# Patient Record
Sex: Female | Born: 1960 | Race: White | Hispanic: No | Smoking: Never smoker
Health system: Southern US, Community
[De-identification: ages and names within clinical notes are randomized; demographics above are authoritative.]

## PROBLEM LIST (undated history)

## (undated) DIAGNOSIS — I10 Essential (primary) hypertension: Secondary | ICD-10-CM

## (undated) DIAGNOSIS — E119 Type 2 diabetes mellitus without complications: Secondary | ICD-10-CM

## (undated) HISTORY — PX: OTHER SURGICAL HISTORY: SHX169

## (undated) HISTORY — PX: BREAST BIOPSY: SHX20

## (undated) HISTORY — PX: ABDOMINAL HYSTERECTOMY: SHX81

## (undated) HISTORY — PX: CATARACT EXTRACTION: SUR2

## (undated) HISTORY — DX: Essential (primary) hypertension: I10

---

## 2015-07-22 ENCOUNTER — Emergency Department: Payer: Self-pay

## 2015-07-22 ENCOUNTER — Emergency Department
Admission: EM | Admit: 2015-07-22 | Discharge: 2015-07-22 | Disposition: A | Discharge status: Home / Self Care | Payer: Self-pay | Attending: Emergency Medicine | Admitting: Emergency Medicine

## 2015-07-22 DIAGNOSIS — G8929 Other chronic pain: Secondary | ICD-10-CM | POA: Insufficient documentation

## 2015-07-22 DIAGNOSIS — M542 Cervicalgia: Secondary | ICD-10-CM | POA: Insufficient documentation

## 2015-07-22 DIAGNOSIS — I10 Essential (primary) hypertension: Secondary | ICD-10-CM | POA: Insufficient documentation

## 2015-07-22 HISTORY — DX: Essential (primary) hypertension: I10
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
## 2015-07-22 NOTE — ED Provider Notes (Signed)
Physician/Midlevel provider first contact with patient: 07/22/15 1430         [facility] Emergency Department  ED ATTENDING NOTE  ___________________________  Attending Flonnie Hailstone  First MD: Kathi Simpers  The patient was seen and examined by PA or NP; and the plan of care was discussed with me.   I have reviewed the history and physical exam documented with any additions/changes documented here.  I was present for the key portions of the exam/procedures.  I have reviewed the patient's medical history, vital signs, nursing notes, and any labs or imaging studies performed.  ___________________________      Scribe Attestation:  I was acting as a Neurosurgeon for Eduardo Osier, MD on Gemma Payor, [place]  I am the first provider for this patient and I personally performed the services documented. Dorathy Daft is scribing for me on [place],Phylisha SUE. This note accurately reflects work and decisions made by me.  Donaven Criswell, Placido Sou, MD      Eduardo Osier, MD  07/23/15 [phone]
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
## 2016-10-13 ENCOUNTER — Other Ambulatory Visit: Payer: Self-pay | Admitting: "Endocrinology

## 2018-03-02 ENCOUNTER — Other Ambulatory Visit: Payer: Self-pay

## 2018-03-02 ENCOUNTER — Encounter (HOSPITAL_COMMUNITY): Payer: Self-pay | Admitting: Emergency Medicine

## 2018-03-02 ENCOUNTER — Emergency Department (HOSPITAL_COMMUNITY)
Admission: EM | Admit: 2018-03-02 | Discharge: 2018-03-03 | Disposition: A | Discharge status: Home / Self Care | Payer: 59 | Attending: Emergency Medicine | Admitting: Emergency Medicine

## 2018-03-02 DIAGNOSIS — H65112 Acute and subacute allergic otitis media (mucoid) (sanguinous) (serous), left ear: Secondary | ICD-10-CM

## 2018-03-02 DIAGNOSIS — H65192 Other acute nonsuppurative otitis media, left ear: Secondary | ICD-10-CM | POA: Diagnosis not present

## 2018-03-02 DIAGNOSIS — R739 Hyperglycemia, unspecified: Secondary | ICD-10-CM

## 2018-03-02 DIAGNOSIS — Z7984 Long term (current) use of oral hypoglycemic drugs: Secondary | ICD-10-CM | POA: Insufficient documentation

## 2018-03-02 DIAGNOSIS — E1165 Type 2 diabetes mellitus with hyperglycemia: Secondary | ICD-10-CM | POA: Insufficient documentation

## 2018-03-02 HISTORY — DX: Type 2 diabetes mellitus without complications: E11.9

## 2018-03-02 LAB — COMPREHENSIVE METABOLIC PANEL
ALBUMIN: 3.8 g/dL (ref 3.5–5.0)
ALK PHOS: 74 U/L (ref 38–126)
ALT: 27 U/L (ref 14–54)
AST: 34 U/L (ref 15–41)
Anion gap: 13 (ref 5–15)
BUN: 16 mg/dL (ref 6–20)
CHLORIDE: 94 mmol/L — AB (ref 101–111)
CO2: 20 mmol/L — AB (ref 22–32)
CREATININE: 1.13 mg/dL — AB (ref 0.44–1.00)
Calcium: 9 mg/dL (ref 8.9–10.3)
GFR calc non Af Amer: 53 mL/min — ABNORMAL LOW (ref >60)
GLUCOSE: 563 mg/dL — AB (ref 65–99)
Potassium: 3.9 mmol/L (ref 3.5–5.1)
SODIUM: 127 mmol/L — AB (ref 135–145)
Total Bilirubin: 0.6 mg/dL (ref 0.3–1.2)
Total Protein: 7.6 g/dL (ref 6.5–8.1)

## 2018-03-02 LAB — CBC WITH DIFFERENTIAL/PLATELET
BASOS ABS: 0 10*3/uL (ref 0.0–0.1)
BASOS PCT: 0 %
EOS ABS: 0 10*3/uL (ref 0.0–0.7)
EOS PCT: 0 %
HCT: 37.4 % (ref 36.0–46.0)
Hemoglobin: 12.6 g/dL (ref 12.0–15.0)
Lymphocytes Relative: 15 %
Lymphs Abs: 1.8 10*3/uL (ref 0.7–4.0)
MCH: 28.8 pg (ref 26.0–34.0)
MCHC: 33.7 g/dL (ref 30.0–36.0)
MCV: 85.4 fL (ref 78.0–100.0)
Monocytes Absolute: 1.1 10*3/uL — ABNORMAL HIGH (ref 0.1–1.0)
Monocytes Relative: 10 %
Neutro Abs: 8.5 10*3/uL — ABNORMAL HIGH (ref 1.7–7.7)
Neutrophils Relative %: 75 %
PLATELETS: 274 10*3/uL (ref 150–400)
RBC: 4.38 MIL/uL (ref 3.87–5.11)
RDW: 13.2 % (ref 11.5–15.5)
WBC: 11.3 10*3/uL — AB (ref 4.0–10.5)

## 2018-03-02 LAB — CBG MONITORING, ED: Glucose-Capillary: 573 mg/dL (ref 65–99)

## 2018-03-02 LAB — URINALYSIS, ROUTINE W REFLEX MICROSCOPIC
BACTERIA UA: NONE SEEN
Bilirubin Urine: NEGATIVE
Glucose, UA: >=500 mg/dL — AB
Ketones, ur: NEGATIVE mg/dL
Leukocytes, UA: NEGATIVE
NITRITE: NEGATIVE
PROTEIN: NEGATIVE mg/dL
Specific Gravity, Urine: 1.031 — ABNORMAL HIGH (ref 1.005–1.030)
pH: 6 (ref 5.0–8.0)

## 2018-03-02 MED ORDER — AMOXICILLIN-POT CLAVULANATE 875-125 MG PO TABS
1.0000 | ORAL_TABLET | Freq: Once | ORAL | Status: AC
  Administered 2018-03-03: 1 via ORAL
  Filled 2018-03-02: qty 1

## 2018-03-02 MED ORDER — INSULIN ASPART 100 UNIT/ML ~~LOC~~ SOLN
10.0000 [IU] | Freq: Once | SUBCUTANEOUS | Status: AC
  Administered 2018-03-03: 10 [IU] via INTRAVENOUS
  Filled 2018-03-02: qty 1

## 2018-03-02 MED ORDER — SODIUM CHLORIDE 0.9 % IV BOLUS (SEPSIS)
1000.0000 mL | Freq: Once | INTRAVENOUS | Status: AC
  Administered 2018-03-03: 1000 mL via INTRAVENOUS

## 2018-03-02 MED ORDER — HYDROCODONE-ACETAMINOPHEN 5-325 MG PO TABS
2.0000 | ORAL_TABLET | Freq: Once | ORAL | Status: AC
  Administered 2018-03-03: 2 via ORAL
  Filled 2018-03-02: qty 2

## 2018-03-02 NOTE — ED Notes (Signed)
Checked CGB 573, RN Bobby informed

## 2018-03-02 NOTE — ED Provider Notes (Signed)
[facility] EMERGENCY DEPARTMENT Provider Note   CSN: [id number] Arrival date & time: 03/02/18  1900     History   Chief Complaint Chief Complaint  Patient presents with  . Hyperglycemia    CBG= 573  . Otalgia    HPI Carly Maxwell is a 57 y.o. female.  Patient presents to the ED with a chief complaint of left ear pain.  She states that the pain started this morning.  This is what brought her to the ED.  She denies any associated fevers, chills, or cough.  She states that she has not taken anything for her symptoms.  She reports that she has been out of her DM meds for the past 3 months due to relocating to the area.  She denies any other associated symptoms.  There are no modifying factors.   The history is provided by the patient. No language interpreter was used.    Past Medical History:  Diagnosis Date  . Diabetes mellitus without complication (HCC)     There are no active problems to display for this patient.   Past Surgical History:  Procedure Laterality Date  . ABDOMINAL HYSTERECTOMY    . arm surgery    . CATARACT EXTRACTION      OB History    No data available       Home Medications    Prior to Admission medications   Medication Sig Start Date End Date Taking? Authorizing Provider  glipiZIDE (GLUCOTROL) 10 MG tablet Take 10 mg by mouth 2 (two) times daily before a meal.   Yes [provider]  metFORMIN (GLUCOPHAGE) 1000 MG tablet Take 1,000 mg by mouth 2 (two) times daily with a meal.   Yes [provider]    Family History No family history on file.  Social History Social History   Tobacco Use  . Smoking status: Never Smoker  . Smokeless tobacco: Never Used  Substance Use Topics  . Alcohol use: No    Frequency: Never  . Drug use: No     Allergies   Lantus [insulin glargine]   Review of Systems Review of Systems  All other systems reviewed and are negative.    Physical Exam Updated Vital  Signs BP (!) 157/84   Pulse (!) 106   Temp 98.3 F (36.8 C) (Oral)   Resp 20   LMP  (Approximate)   SpO2 99%   Physical Exam  Constitutional: She is oriented to person, place, and time. She appears well-developed and well-nourished.  HENT:  Head: Normocephalic and atraumatic.  Left mucoid effusion with minimal erythema involving TM Right is clear  Eyes: Conjunctivae and EOM are normal. Pupils are equal, round, and reactive to light.  Neck: Normal range of motion. Neck supple.  Cardiovascular: Regular rhythm. Exam reveals no gallop and no friction rub.  No murmur heard. mild tachycardia  Pulmonary/Chest: Effort normal and breath sounds normal. No respiratory distress. She has no wheezes. She has no rales. She exhibits no tenderness.  Abdominal: Soft. Bowel sounds are normal. She exhibits no distension and no mass. There is no tenderness. There is no rebound and no guarding.  Musculoskeletal: Normal range of motion. She exhibits no edema or tenderness.  Neurological: She is alert and oriented to person, place, and time.  Skin: Skin is warm and dry.  Psychiatric: She has a normal mood and affect. Her behavior is normal. Judgment and thought content normal.  Nursing note and vitals reviewed.  ED Treatments / Results  Labs (all labs ordered are listed, but only abnormal results are displayed) Labs Reviewed  CBC WITH DIFFERENTIAL/PLATELET - Abnormal; Notable for the following components:      Result Value   WBC 11.3 (*)    Neutro Abs 8.5 (*)    Monocytes Absolute 1.1 (*)    All other components within normal limits  COMPREHENSIVE METABOLIC PANEL - Abnormal; Notable for the following components:   Sodium 127 (*)    Chloride 94 (*)    CO2 20 (*)    Glucose, Bld 563 (*)    Creatinine, Ser 1.13 (*)    GFR calc non Af Amer 53 (*)    All other components within normal limits  URINALYSIS, ROUTINE W REFLEX MICROSCOPIC - Abnormal; Notable for the following components:   Color,  Urine STRAW (*)    Specific Gravity, Urine 1.031 (*)    Glucose, UA >=500 (*)    Hgb urine dipstick SMALL (*)    Squamous Epithelial / LPF 0-5 (*)    All other components within normal limits  CBG MONITORING, ED - Abnormal; Notable for the following components:   Glucose-Capillary 573 (*)    All other components within normal limits  CBG MONITORING, ED - Abnormal; Notable for the following components:   Glucose-Capillary 246 (*)    All other components within normal limits    EKG  EKG Interpretation None       Radiology No results found.  Procedures Procedures (including critical care time)  Medications Ordered in ED Medications  HYDROcodone-acetaminophen (NORCO/VICODIN) 5-325 MG per tablet 2 tablet (not administered)  amoxicillin-clavulanate (AUGMENTIN) 875-125 MG per tablet 1 tablet (not administered)  sodium chloride 0.9 % bolus 1,000 mL (not administered)  insulin aspart (novoLOG) injection 10 Units (not administered)     Initial Impression / Assessment and Plan / ED Course  I have reviewed the triage vital signs and the nursing notes.  Pertinent labs & imaging results that were available during my care of the patient were reviewed by me and considered in my medical decision making (see chart for details).     Patient with left sided otalgia.  Exam is consistent with OM.  Will treat with augmentin.  Noted to be hyperglycemic.  Given IV insulin and IVF.  Glucose trending down nicely.  Will refill home meds as courtesy.  [facility] f/u.  Final Clinical Impressions(s) / ED Diagnoses   Final diagnoses:  Hyperglycemia  Acute mucoid otitis media of left ear    ED Discharge Orders        Ordered    amoxicillin-clavulanate (AUGMENTIN) 875-125 MG tablet  Every 12 hours     03/03/18 0157    glipiZIDE (GLUCOTROL) 10 MG tablet  2 times daily before meals     03/03/18 0157    metFORMIN (GLUCOPHAGE) 1000 MG tablet  2 times daily with meals      03/03/18 0157       Roxy Horseman, PA-C 03/03/18 0159    Tegeler, Canary Brim, MD 03/03/18 0230

## 2018-03-02 NOTE — ED Notes (Signed)
Pt alert and oriented x4. Skin warm and dry. Respirations equal and unlabored. s1 and s2 heart sounds audible. Pt states is type 2 diabetic and has not had her meds in three months. Her prescription run out and she has is not from the area.

## 2018-03-02 NOTE — ED Triage Notes (Signed)
Patient reports left earache onset last night , denies injury , no hearing loss or drainage , CBG= 573 at triage .

## 2018-03-03 ENCOUNTER — Other Ambulatory Visit: Payer: Self-pay

## 2018-03-03 LAB — CBG MONITORING, ED: Glucose-Capillary: 246 mg/dL — ABNORMAL HIGH (ref 65–99)

## 2018-03-03 MED ORDER — METFORMIN HCL 1000 MG PO TABS: 1000.0000 mg | ORAL_TABLET | Freq: Two times a day (BID) | ORAL | 0 refills | Status: AC

## 2018-03-03 MED ORDER — GLIPIZIDE 10 MG PO TABS: 10.0000 mg | ORAL_TABLET | Freq: Two times a day (BID) | ORAL | 0 refills | Status: DC

## 2018-03-03 MED ORDER — AMOXICILLIN-POT CLAVULANATE 875-125 MG PO TABS: 1.0000 | ORAL_TABLET | Freq: Two times a day (BID) | ORAL | 0 refills | Status: DC

## 2018-03-03 NOTE — ED Notes (Signed)
BGL 246

## 2018-04-29 DIAGNOSIS — Z794 Long term (current) use of insulin: Secondary | ICD-10-CM

## 2018-04-29 DIAGNOSIS — IMO0001 Reserved for inherently not codable concepts without codable children: Secondary | ICD-10-CM | POA: Insufficient documentation

## 2018-04-29 DIAGNOSIS — E1165 Type 2 diabetes mellitus with hyperglycemia: Secondary | ICD-10-CM

## 2018-06-17 ENCOUNTER — Emergency Department (HOSPITAL_COMMUNITY)
Admission: EM | Admit: 2018-06-17 | Discharge: 2018-06-17 | Disposition: A | Discharge status: Home / Self Care | Payer: 59 | Attending: Emergency Medicine | Admitting: Emergency Medicine

## 2018-06-17 ENCOUNTER — Other Ambulatory Visit: Payer: Self-pay

## 2018-06-17 ENCOUNTER — Emergency Department (HOSPITAL_COMMUNITY): Payer: 59

## 2018-06-17 ENCOUNTER — Encounter (HOSPITAL_COMMUNITY): Payer: Self-pay | Admitting: Emergency Medicine

## 2018-06-17 DIAGNOSIS — K529 Noninfective gastroenteritis and colitis, unspecified: Secondary | ICD-10-CM | POA: Diagnosis not present

## 2018-06-17 DIAGNOSIS — E119 Type 2 diabetes mellitus without complications: Secondary | ICD-10-CM | POA: Diagnosis not present

## 2018-06-17 DIAGNOSIS — Z79899 Other long term (current) drug therapy: Secondary | ICD-10-CM | POA: Insufficient documentation

## 2018-06-17 DIAGNOSIS — R109 Unspecified abdominal pain: Secondary | ICD-10-CM | POA: Diagnosis present

## 2018-06-17 LAB — URINALYSIS, ROUTINE W REFLEX MICROSCOPIC
BILIRUBIN URINE: NEGATIVE
Hgb urine dipstick: NEGATIVE
Ketones, ur: 5 mg/dL — AB
LEUKOCYTES UA: NEGATIVE
Nitrite: NEGATIVE
PH: 5 (ref 5.0–8.0)
Protein, ur: NEGATIVE mg/dL
Specific Gravity, Urine: 1.032 — ABNORMAL HIGH (ref 1.005–1.030)

## 2018-06-17 LAB — COMPREHENSIVE METABOLIC PANEL
ALT: 19 U/L (ref 0–44)
AST: 25 U/L (ref 15–41)
Albumin: 3.9 g/dL (ref 3.5–5.0)
Alkaline Phosphatase: 77 U/L (ref 38–126)
Anion gap: 11 (ref 5–15)
BILIRUBIN TOTAL: 0.4 mg/dL (ref 0.3–1.2)
BUN: 10 mg/dL (ref 6–20)
CALCIUM: 9.2 mg/dL (ref 8.9–10.3)
CO2: 21 mmol/L — ABNORMAL LOW (ref 22–32)
Chloride: 106 mmol/L (ref 98–111)
Creatinine, Ser: 0.59 mg/dL (ref 0.44–1.00)
Glucose, Bld: 300 mg/dL — ABNORMAL HIGH (ref 70–99)
Potassium: 4 mmol/L (ref 3.5–5.1)
Sodium: 138 mmol/L (ref 135–145)
TOTAL PROTEIN: 7.3 g/dL (ref 6.5–8.1)

## 2018-06-17 LAB — CBC
HCT: 40.5 % (ref 36.0–46.0)
Hemoglobin: 13.5 g/dL (ref 12.0–15.0)
MCH: 28.6 pg (ref 26.0–34.0)
MCHC: 33.3 g/dL (ref 30.0–36.0)
MCV: 85.8 fL (ref 78.0–100.0)
PLATELETS: 338 10*3/uL (ref 150–400)
RBC: 4.72 MIL/uL (ref 3.87–5.11)
RDW: 13.2 % (ref 11.5–15.5)
WBC: 6.4 10*3/uL (ref 4.0–10.5)

## 2018-06-17 LAB — LIPASE, BLOOD: Lipase: 45 U/L (ref 11–51)

## 2018-06-17 LAB — POC OCCULT BLOOD, ED: Fecal Occult Bld: POSITIVE — AB

## 2018-06-17 MED ORDER — METRONIDAZOLE 500 MG PO TABS: 500.0000 mg | ORAL_TABLET | Freq: Three times a day (TID) | ORAL | 0 refills | Status: AC

## 2018-06-17 MED ORDER — HYDROMORPHONE HCL 1 MG/ML IJ SOLN
0.5000 mg | Freq: Once | INTRAMUSCULAR | Status: AC
  Administered 2018-06-17: 0.5 mg via INTRAVENOUS
  Filled 2018-06-17: qty 1

## 2018-06-17 MED ORDER — ONDANSETRON 4 MG PO TBDP: 4.0000 mg | ORAL_TABLET | Freq: Three times a day (TID) | ORAL | 0 refills | Status: AC | PRN

## 2018-06-17 MED ORDER — IOHEXOL 300 MG/ML  SOLN
100.0000 mL | Freq: Once | INTRAMUSCULAR | Status: AC | PRN
  Administered 2018-06-17: 100 mL via INTRAVENOUS

## 2018-06-17 MED ORDER — SODIUM CHLORIDE 0.9 % IV BOLUS: 1000.0000 mL | Freq: Once | INTRAVENOUS | Status: DC

## 2018-06-17 MED ORDER — ONDANSETRON HCL 4 MG/2ML IJ SOLN
4.0000 mg | Freq: Once | INTRAMUSCULAR | Status: AC
  Administered 2018-06-17: 4 mg via INTRAVENOUS
  Filled 2018-06-17: qty 2

## 2018-06-17 MED ORDER — CIPROFLOXACIN HCL 500 MG PO TABS: 500.0000 mg | ORAL_TABLET | Freq: Two times a day (BID) | ORAL | 0 refills | Status: AC

## 2018-06-17 MED ORDER — OXYCODONE-ACETAMINOPHEN 5-325 MG PO TABS: 1.0000 | ORAL_TABLET | Freq: Three times a day (TID) | ORAL | 0 refills | Status: AC | PRN

## 2018-06-17 MED ORDER — SODIUM CHLORIDE 0.9 % IV BOLUS
1000.0000 mL | Freq: Once | INTRAVENOUS | Status: AC
  Administered 2018-06-17: 1000 mL via INTRAVENOUS

## 2018-06-17 NOTE — ED Notes (Signed)
Patient transported to CT 

## 2018-06-17 NOTE — ED Triage Notes (Signed)
Patient presents to the ED with complaints of  epigastric Abd pain that started last night Patient reports nausea denies any vomiting. Patient reports diarrhea. Patient states she is a diabetic and has not taken her insulin since yesterday due to pain. Patient  Reports increase burping. Patient alert and oriented rates pain 10/10.

## 2018-06-17 NOTE — Discharge Instructions (Addendum)
Thank you for allowing me to provide your care today in the emergency department.  Please call [facility] to schedule a follow-up appointment.  For  your abdominal pain, you are being prescribed 2 different antibiotics.  The first is ciprofloxacin and should be taken 1 tablet every 12 hours for the next 7 days.  The second antibiotic is metronidazole, which should be taken 1 tablet every 8 hours for the next 7 days.  Please make sure to avoid all alcohol while taking this medication as it can cause severe vomiting.  For mild to moderate pain, you can take 600 mg of ibuprofen with food or 650 mg of Tylenol once every 6 hours.  For severe, uncontrollable pain, take 1 tablet of Percocet every 8 hours.  Please do not drive or work while taking this medication because it is a narcotic and can cause you to be impaired.  Is also a narcotic and can be addicting.  For nausea, take 1 tablet of Zofran and let it dissolve under your tongue every 8 hours.  For the next few days, you can follow a bland diet until your symptoms start to improve.  Return to the emergency department if you have vomiting despite taking Zofran, high fever despite taking Tylenol or ibuprofen, severe, uncontrollable pain, or other new concerning symptoms.

## 2018-06-17 NOTE — ED Provider Notes (Signed)
[facility] Provider Note   CSN: [id number] Arrival date & time: 06/17/18  [id number]     History   Chief Complaint Chief Complaint  Patient presents with  . Abdominal Pain    HPI Carly Maxwell is a 57 y.o. female with a h/o of DM Type II presents to the emergency department from her PCP's office with a chief complaint of abdominal pain.  The patient endorses sudden onset, constant epigastric pain that began last night and radiates to her mid back.  She report the pain is constantly dull and moderate in severity, but she has waves of severe, crampy pain.  She reports associated nausea and 6-7 episodes of nonbloody diarrhea.  She also endorses some mild dull periumbilical pain and dysuria.  She reports that dysuria has been ongoing for the last few weeks.  She denies fever, chills, vaginal pain, itching, discharge, or bleeding, chest pain, dyspnea, hematuria, melena, or hematochezia.  She reports increased belching, but no increased flatulence.  No known aggravating or alleviating factors.  No recent travel or camping.  No known sick contacts.  She reports that she was unable to take her morning dose of insulin because the pain was so severe.  She reports that she left work due to the pain and went to see her PCP who checked her blood sugar and found that it was 332.  She reports that it typically runs in the 130s when she checks it in the morning.  No treatment prior to arrival.  Surgical history includes hysterectomy and cholecystectomy.  Antibiotic use was approximately 1 month ago when she was treated with amoxicillin for an ear infection.  She denies alcohol use, illicit or recreational drug use, and does not smoke cigarettes.  The history is provided by the patient. No language interpreter was used.    Past Medical History:  Diagnosis Date  . Diabetes mellitus without complication [facility](HCC)     Patient Active Problem List   Diagnosis Date Noted    . Uncontrolled type 2 diabetes mellitus without complication, with long-term current use of insulin (HCC) 04/29/2018    Past Surgical History:  Procedure Laterality Date  . ABDOMINAL HYSTERECTOMY    . arm surgery    . CATARACT EXTRACTION       OB History   None      Home Medications    Prior to Admission medications   Medication Sig Start Date End Date Taking? Authorizing Provider  calcium carbonate (TUMS - DOSED IN MG ELEMENTAL CALCIUM) 500 MG chewable tablet Chew 2 tablets by mouth daily as needed for indigestion or heartburn.   Yes [provider]  metFORMIN (GLUCOPHAGE) 1000 MG tablet Take 1 tablet (1,000 mg total) by mouth 2 (two) times daily with a meal. Patient taking differently: Take 500 mg by mouth 2 (two) times daily with a meal.  03/03/18  Yes Roxy HorsemanBrowning, Robert, PA-C  TRESIBA FLEXTOUCH 200 UNIT/ML SOPN Inject 16 Units into the skin daily. 05/13/18  Yes [provider]  VICTOZA 18 MG/3ML SOPN Inject 1.2 mg into the skin daily. 06/09/18  Yes [provider]  ciprofloxacin (CIPRO) 500 MG tablet Take 1 tablet (500 mg total) by mouth every 12 (twelve) hours for 7 days. 06/17/18 06/24/18  Greysen Swanton A, PA-C  metroNIDAZOLE (FLAGYL) 500 MG tablet Take 1 tablet (500 mg total) by mouth 3 (three) times daily for 7 days. 06/17/18 06/24/18  Vanesha Athens A, PA-C  ondansetron (ZOFRAN ODT) 4 MG disintegrating  tablet Take 1 tablet (4 mg total) by mouth every 8 (eight) hours as needed for nausea or vomiting. 06/17/18   Shell Blanchette A, PA-C  oxyCODONE-acetaminophen (PERCOCET/ROXICET) 5-325 MG tablet Take 1 tablet by mouth every 8 (eight) hours as needed for severe pain. 06/17/18   Elizzie Westergard A, PA-C    Family History No family history on file.  Social History Social History   Tobacco Use  . Smoking status: Never Smoker  . Smokeless tobacco: Never Used  Substance Use Topics  . Alcohol use: No    Frequency: Never  . Drug use: No     Allergies   Lantus  [insulin glargine]   Review of Systems Review of Systems  Constitutional: Negative for activity change, chills and fever.  HENT: Negative for congestion.   Respiratory: Negative for shortness of breath.   Cardiovascular: Negative for chest pain.  Gastrointestinal: Positive for abdominal pain, diarrhea and nausea. Negative for blood in stool, constipation and vomiting.  Genitourinary: Positive for dysuria. Negative for flank pain, hematuria, vaginal bleeding, vaginal discharge and vaginal pain.  Musculoskeletal: Negative for back pain.  Skin: Negative for rash.  Allergic/Immunologic: Negative for immunocompromised state.  Neurological: Negative for weakness and headaches.  Psychiatric/Behavioral: Negative for confusion.   Physical Exam Updated Vital Signs BP (!) 158/86   Pulse 72   Temp 98.1 F (36.7 C) (Oral)   Resp 14   Ht 5\' 3"  (1.6 m)   Wt 75.8 kg (167 lb)   SpO2 100%   BMI 29.58 kg/m   Physical Exam  Constitutional: No distress.  Uncomfortable appearing  HENT:  Head: Normocephalic.  Eyes: Conjunctivae are normal.  Neck: Neck supple.  Cardiovascular: Normal rate, regular rhythm, normal heart sounds and intact distal pulses. Exam reveals no gallop and no friction rub.  No murmur heard. Pulmonary/Chest: Effort normal. No stridor. No respiratory distress. She has no wheezes. She has no rales. She exhibits no tenderness.  Abdominal: Soft. She exhibits no distension and no mass. There is tenderness. There is no rebound and no guarding. No hernia.  Hyperactive bowel sounds in all 4 quadrants.  Patient is exquisitely tender to palpation in the left upper and lower quadrants with minimal palpation.  Mild right upper quadrant tenderness.  Guarding is present.  No rebound tenderness.    Right lower quadrant is nontender.  No CVA tenderness bilaterally.  Musculoskeletal: She exhibits no edema, tenderness or deformity.  Neurological: She is alert.  Skin: Skin is warm. Capillary  refill takes less than 2 seconds. No rash noted.  Psychiatric: Her behavior is normal.  Nursing note and vitals reviewed.    ED Treatments / Results  Labs (all labs ordered are listed, but only abnormal results are displayed) Labs Reviewed  COMPREHENSIVE METABOLIC PANEL - Abnormal; Notable for the following components:      Result Value   CO2 21 (*)    Glucose, Bld 300 (*)    All other components within normal limits  URINALYSIS, ROUTINE W REFLEX MICROSCOPIC - Abnormal; Notable for the following components:   APPearance HAZY (*)    Specific Gravity, Urine 1.032 (*)    Glucose, UA >=500 (*)    Ketones, ur 5 (*)    Bacteria, UA FEW (*)    All other components within normal limits  POC OCCULT BLOOD, ED - Abnormal; Notable for the following components:   Fecal Occult Bld POSITIVE (*)    All other components within normal limits  LIPASE, BLOOD  CBC  CBG MONITORING, ED    EKG None  Radiology Ct Abdomen Pelvis W Contrast  Result Date: 06/17/2018 CLINICAL DATA:  Generalized abdominal pain. EXAM: CT ABDOMEN AND PELVIS WITH CONTRAST TECHNIQUE: Multidetector CT imaging of the abdomen and pelvis was performed using the standard protocol following bolus administration of intravenous contrast. CONTRAST:  OMNIPAQUE IOHEXOL 300 MG/ML  SOLN COMPARISON:  None. FINDINGS: Lower chest: Lung bases demonstrate minimal bibasilar atelectatic change. Fissural lymph node over the right base. Hepatobiliary: Previous cholecystectomy. Liver and biliary tree are normal. Pancreas: Normal. Spleen: Normal. Adrenals/Urinary Tract: Adrenal glands are normal. Kidneys normal size without hydronephrosis or nephrolithiasis. 3.8 cm cyst over the mid pole left kidney. Ureters and bladder are normal. Stomach/Bowel: Stomach is normal. There are a few minimally prominent nondilated small bowel loops over the left abdomen and lower abdomen. There are a few small bowel loops in the left mid abdomen with mildly thickened  wall but no adjacent inflammatory change or free fluid. Appendix is normal. Colon is normal. Vascular/Lymphatic: Normal. Reproductive: Previous hysterectomy. Other: Single surgical clip over the right pelvis. There is no free fluid or focal inflammatory change present. Musculoskeletal: Mild degenerate change of the spine and hips. IMPRESSION: Few small bowel loops over the left mid abdomen with mild wall thickening. No adjacent free fluid or inflammatory change. Findings may be due to a regional enteritis of infectious or inflammatory nature. 3.8 cm left renal cyst. Electronically Signed   By: Elberta Fortis M.D.   On: 06/17/2018 13:28    Procedures Procedures (including critical care time)  Medications Ordered in ED Medications  sodium chloride 0.9 % bolus 1,000 mL (has no administration in time range)  ondansetron (ZOFRAN) injection 4 mg (4 mg Intravenous Given 06/17/18 1021)  sodium chloride 0.9 % bolus 1,000 mL (0 mLs Intravenous Stopped 06/17/18 1534)  HYDROmorphone (DILAUDID) injection 0.5 mg (0.5 mg Intravenous Given 06/17/18 1021)  HYDROmorphone (DILAUDID) injection 0.5 mg (0.5 mg Intravenous Given 06/17/18 1243)  iohexol (OMNIPAQUE) 300 MG/ML solution 100 mL (100 mLs Intravenous Contrast Given 06/17/18 1300)     Initial Impression / Assessment and Plan / ED Course  I have reviewed the triage vital signs and the nursing notes.  Pertinent labs & imaging results that were available during my care of the patient were reviewed by me and considered in my medical decision making (see chart for details).     57 year old female with a history of diabetes mellitus type 2 presenting with epigastric and suprapubic pain, nausea, and diarrhea, onset last night.  On exam, she appears uncomfortable and is exquisitely tender with minimal palpation to the bilateral upper quadrants.  She also has some mild suprapubic tenderness.  CBG was 332 at her PCPs office this morning.  IV fluid bolus, Zofran, and  Dilaudid ordered.  Will order basic labs and UA.  Labs are notable for glucose of 300, glucosuria.  No leukocytosis.  Patient was seen and evaluated along with Dr. Clarice Pole, attending physician.  CT abdomen pelvis with few small bowel loops over the left mid abdomen with mild wall thickening, concerning for regional enteritis of infectious or inflammatory nature.  Differential diagnosis includes mesenteric ischemia, infectious enterocolitis, gastroenteritis, inflammatory enterocolitis.  Hemoccult is positive; however, the patient does not have other risk factors for mesenteric ischemia.  Suspect, given hyperglycemia, infectious enterocolitis.  Will discharge with Percocet for pain control, Zofran for nausea, ciprofloxacin, and metronidazole.  A 18-month prescription history query was performed using the [place] CSRS prior to discharge.  She has been given a referral for gastroenterology follow-up.  Strict return precautions given.  Patient is hemodynamically stable in no acute distress.  She is safe for discharge to home with outpatient follow-up at this time.  Final Clinical Impressions(s) / ED Diagnoses   Final diagnoses:  Enterocolitis    ED Discharge Orders        Ordered    oxyCODONE-acetaminophen (PERCOCET/ROXICET) 5-325 MG tablet  Every 8 hours PRN     06/17/18 1512    ondansetron (ZOFRAN ODT) 4 MG disintegrating tablet  Every 8 hours PRN     06/17/18 1512    ciprofloxacin (CIPRO) 500 MG tablet  Every 12 hours     06/17/18 1512    metroNIDAZOLE (FLAGYL) 500 MG tablet  3 times daily     06/17/18 1512       Tere Mcconaughey A, PA-C 06/17/18 1630    Arby Barrette, MD 06/25/18 1714

## 2018-06-17 NOTE — ED Provider Notes (Signed)
She developed abdominal pain in the left upper quadrant and central abdomen.  There have been both crampy and sharp episodes of pain.  She has had multiple episodes of nonbloody diarrhea.  Nausea but no vomiting.  Patient is alert and appropriate.  No respiratory distress.  Abdomen is soft. no Guarding.  Rectal exam no blood.  Thin yellow stool.  CT shows some mild inflammatory changes in the small colon suggestive of regional enteritis.  Patient is otherwise nontoxic and clinically well in appearance.  Agree with plan of management.   Arby BarrettePfeiffer, Teyona Nichelson, MD 06/17/18 1359

## 2018-06-18 NOTE — ED Notes (Signed)
0.5 mg hydromorphone wasted in sharps and witnessed with Donnetta SimpersJanee' Crews RN

## 2018-06-21 ENCOUNTER — Encounter: Payer: Self-pay | Admitting: Gastroenterology

## 2018-07-06 ENCOUNTER — Ambulatory Visit: Payer: 59 | Admitting: Gastroenterology

## 2018-08-12 ENCOUNTER — Other Ambulatory Visit: Payer: Self-pay | Admitting: Primary Care

## 2018-08-12 DIAGNOSIS — R928 Other abnormal and inconclusive findings on diagnostic imaging of breast: Secondary | ICD-10-CM

## 2018-08-16 ENCOUNTER — Other Ambulatory Visit: Payer: Self-pay | Admitting: Primary Care

## 2018-08-16 ENCOUNTER — Ambulatory Visit: Payer: No Typology Code available for payment source | Attending: Primary Care

## 2018-08-16 DIAGNOSIS — R928 Other abnormal and inconclusive findings on diagnostic imaging of breast: Secondary | ICD-10-CM | POA: Insufficient documentation

## 2018-08-16 MED ORDER — GADOBUTROL 1 MMOL/ML IV SOLN
6.5000 mL | Freq: Once | INTRAVENOUS | Status: AC | PRN
Start: 2018-08-16 — End: 2018-08-16
  Administered 2018-08-16: 6.5 mmol via INTRAVENOUS
  Filled 2018-08-16: qty 7.5

## 2019-01-03 ENCOUNTER — Encounter: Payer: Self-pay | Admitting: Podiatry

## 2019-01-03 ENCOUNTER — Ambulatory Visit: Payer: 59 | Admitting: Podiatry

## 2019-01-03 VITALS — BP 120/73

## 2019-01-03 DIAGNOSIS — L84 Corns and callosities: Secondary | ICD-10-CM

## 2019-01-03 DIAGNOSIS — E119 Type 2 diabetes mellitus without complications: Secondary | ICD-10-CM

## 2019-01-03 NOTE — Progress Notes (Signed)
Subjective: Carly Maxwell presents today for diabetic foot exam referred by her PCP.  She has 22-1/2-year history of diabetes.   Carly Maxwell also relates history of foreign body to her left foot approximately 25 years ago.    She relates fracture of her left fifth toe about 1-1/2 years ago.    She also has a history of broken left leg after a fall down the stairs.  She did not have surgery for this accident.  She used a cam walker. This was approximately 17 years ago.  Carly Maxwell relates that her feet felt numb a couple of weeks ago but this has since resolved.   Past Medical History:  Diagnosis Date  . Diabetes mellitus without complication [facility])     Patient Active Problem List   Diagnosis Date Noted  . Uncontrolled type 2 diabetes mellitus without complication, with long-term current use of insulin (HCC) 04/29/2018    Past Surgical History:  Procedure Laterality Date  . ABDOMINAL HYSTERECTOMY    . arm surgery    . CATARACT EXTRACTION       Current Outpatient Medications:  .  insulin aspart protamine - aspart (NOVOLOG MIX 70/30 FLEXPEN) (70-30) 100 UNIT/ML FlexPen, Inject into the skin., Disp: , Rfl:  .  lisinopril (PRINIVIL,ZESTRIL) 5 MG tablet, Take 5 mg by mouth daily., Disp: , Rfl:  .  metFORMIN (GLUCOPHAGE) 1000 MG tablet, Take 1 tablet (1,000 mg total) by mouth 2 (two) times daily with a meal. (Patient taking differently: Take 500 mg by mouth 2 (two) times daily with a meal. ), Disp: 60 tablet, Rfl: 0 .  ondansetron (ZOFRAN ODT) 4 MG disintegrating tablet, Take 1 tablet (4 mg total) by mouth every 8 (eight) hours as needed for nausea or vomiting., Disp: 20 tablet, Rfl: 0 .  TRESIBA FLEXTOUCH 200 UNIT/ML SOPN, Inject 16 Units into the skin daily., Disp: , Rfl: 3 .  calcium carbonate (TUMS - DOSED IN MG ELEMENTAL CALCIUM) 500 MG chewable tablet, Chew 2 tablets by mouth daily as needed for indigestion or heartburn., Disp: , Rfl:  .  oxyCODONE-acetaminophen (PERCOCET/ROXICET)  5-325 MG tablet, Take 1 tablet by mouth every 8 (eight) hours as needed for severe pain. (Patient not taking: Reported on 01/03/2019), Disp: 10 tablet, Rfl: 0 .  VICTOZA 18 MG/3ML SOPN, Inject 1.2 mg into the skin daily., Disp: , Rfl: 5  Allergies  Allergen Reactions  . Lantus [Insulin Glargine] Hives and Cough    Social History   Occupational History  . Not on file  Tobacco Use  . Smoking status: Never Smoker  . Smokeless tobacco: Never Used  Substance and Sexual Activity  . Alcohol use: No    Frequency: Never  . Drug use: No  . Sexual activity: Not on file    No family history on file.   There is no immunization history on file for this patient.   Review of systems: Positive Findings in bold print.  Constitutional:  chills, fatigue, fever, sweats, weight change Communication: Nurse, learning disability, sign Presenter, broadcasting, hand writing, iPad/Android device Head: headaches, head injury Eyes: changes in vision, eye pain, glaucoma, cataracts, macular degeneration, diplopia, glare,  light sensitivity, eyeglasses or contacts, blindness Ears nose mouth throat: Hard of hearing, ringing in ears, deaf, sign language,  vertigo,   nosebleeds,  rhinitis,  cold sores, snoring, swollen glands Cardiovascular: HTN, edema, arrhythmia, pacemaker in place, defibrillator in place,  chest pain/tightness, chronic anticoagulation, blood clot, heart failure Peripheral Vascular: leg cramps, varicose veins, blood clots, lymphedema  Respiratory:  difficulty breathing, denies congestion, SOB, wheezing, cough, emphysema Gastrointestinal: change in appetite or weight, abdominal pain, constipation, diarrhea, nausea, vomiting, vomiting blood, change in bowel habits, abdominal pain, jaundice, rectal bleeding, hemorrhoids, Genitourinary:  nocturia,  pain on urination,  blood in urine, Foley catheter, urinary urgency Musculoskeletal: uses mobility aid,  cramping, stiff joints, painful joints, decreased joint motion,  fractures, OA, gout Skin: +changes in toenails, color change, dryness, itching, mole changes,  rash  Neurological: headaches, numbness in feet, paresthesias in feet, burning in feet, fainting,  seizures, change in speech. denies headaches, memory problems/poor historian, cerebral palsy, weakness, paralysis Endocrine: diabetes, hypothyroidism, hyperthyroidism,  goiter, dry mouth, flushing, heat intolerance,  cold intolerance,  excessive thirst, denies polyuria,  nocturia Hematological:  easy bleeding, excessive bleeding, easy bruising, enlarged lymph nodes, on long term blood thinner, history of past transusions Allergy/immunological:  hives, eczema, frequent infections, multiple drug allergies, seasonal allergies, transplant recipient Psychiatric:  anxiety, depression, mood disorder, suicidal ideations, hallucinations   Objective: Vascular Examination: Capillary refill time immediate x 10 digits Dorsalis pedis and posterior tibial pulses present b/l Digital hair present x 10 digits Skin temperature gradient WNL b/l  Dermatological Examination: Skin with normal turgor, texture and tone b/l  Toenails 1-5 b/l well-maintained and of adequate length today.  She has hyperkeratotic lesions noted plantar medial hallux bilaterally.  There is no erythema, no edema, no drainage, no flocculence noted.  Musculoskeletal: Muscle strength 5/5 to all LE muscle groups  Neurological: Sensation intact with 10 gram monofilament Vibratory sensation intact.  Shoe evaluation: She is wearing a black pair of flat canvas sneakers similar to [employer] brand.  This shoe offers no instability nor arch support.  Assessment: 1. Painful onychomycosis toenails 1-5 b/l  2. Calluses sub-hallux bilaterally 3. NIDDM  Plan: 1. Discussed diabetic foot care principles. Literature dispensed on today. 2. Since her toenails were of adequate length, nail debridement deferred on today. 3. Hyperkeratotic lesions pared with  sterile blade without incident. 4. Advised patient to purchase new balance sneaker 600 series or higher.  Patient to continue soft, supportive shoe gear 5. Patient to report any pedal injuries to medical professional immediately. 6. Follow up 3 months. Patient/POA to call should there be a concern in the interim.

## 2019-01-03 NOTE — Patient Instructions (Signed)
Diabetes Mellitus and Foot Care  Foot care is an important part of your health, especially when you have diabetes. Diabetes may cause you to have problems because of poor blood flow (circulation) to your feet and legs, which can cause your skin to:   Become thinner and drier.   Break more easily.   Heal more slowly.   Peel and crack.  You may also have nerve damage (neuropathy) in your legs and feet, causing decreased feeling in them. This means that you may not notice minor injuries to your feet that could lead to more serious problems. Noticing and addressing any potential problems early is the best way to prevent future foot problems.  How to care for your feet  Foot hygiene   Wash your feet daily with warm water and mild soap. Do not use hot water. Then, pat your feet and the areas between your toes until they are completely dry. Do not soak your feet as this can dry your skin.   Trim your toenails straight across. Do not dig under them or around the cuticle. File the edges of your nails with an emery board or nail file.   Apply a moisturizing lotion or petroleum jelly to the skin on your feet and to dry, brittle toenails. Use lotion that does not contain alcohol and is unscented. Do not apply lotion between your toes.  Shoes and socks   Wear clean socks or stockings every day. Make sure they are not too tight. Do not wear knee-high stockings since they may decrease blood flow to your legs.   Wear shoes that fit properly and have enough cushioning. Always look in your shoes before you put them on to be sure there are no objects inside.   To break in new shoes, wear them for just a few hours a day. This prevents injuries on your feet.  Wounds, scrapes, corns, and calluses   Check your feet daily for blisters, cuts, bruises, sores, and redness. If you cannot see the bottom of your feet, use a mirror or ask someone for help.   Do not cut corns or calluses or try to remove them with medicine.   If you  find a minor scrape, cut, or break in the skin on your feet, keep it and the skin around it clean and dry. You may clean these areas with mild soap and water. Do not clean the area with peroxide, alcohol, or iodine.   If you have a wound, scrape, corn, or callus on your foot, look at it several times a day to make sure it is healing and not infected. Check for:  ? Redness, swelling, or pain.  ? Fluid or blood.  ? Warmth.  ? Pus or a bad smell.  General instructions   Do not cross your legs. This may decrease blood flow to your feet.   Do not use heating pads or hot water bottles on your feet. They may burn your skin. If you have lost feeling in your feet or legs, you may not know this is happening until it is too late.   Protect your feet from hot and cold by wearing shoes, such as at the beach or on hot pavement.   Schedule a complete foot exam at least once a year (annually) or more often if you have foot problems. If you have foot problems, report any cuts, sores, or bruises to your health care provider immediately.  Contact a health care provider if:     You have a medical condition that increases your risk of infection and you have any cuts, sores, or bruises on your feet.   You have an injury that is not healing.   You have redness on your legs or feet.   You feel burning or tingling in your legs or feet.   You have pain or cramps in your legs and feet.   Your legs or feet are numb.   Your feet always feel cold.   You have pain around a toenail.  Get help right away if:   You have a wound, scrape, corn, or callus on your foot and:  ? You have pain, swelling, or redness that gets worse.  ? You have fluid or blood coming from the wound, scrape, corn, or callus.  ? Your wound, scrape, corn, or callus feels warm to the touch.  ? You have pus or a bad smell coming from the wound, scrape, corn, or callus.  ? You have a fever.  ? You have a red line going up your leg.  Summary   Check your feet every day  for cuts, sores, red spots, swelling, and blisters.   Moisturize feet and legs daily.   Wear shoes that fit properly and have enough cushioning.   If you have foot problems, report any cuts, sores, or bruises to your health care provider immediately.   Schedule a complete foot exam at least once a year (annually) or more often if you have foot problems.  This information is not intended to replace advice given to you by your health care provider. Make sure you discuss any questions you have with your health care provider.  Document Released: 12/05/2000 Document Revised: 01/20/2018 Document Reviewed: 01/09/2017  [employer]vier Interactive Patient Education  2019 [employer]

## 2019-04-04 ENCOUNTER — Ambulatory Visit: Payer: 59 | Admitting: Podiatry

## 2019-04-23 ENCOUNTER — Other Ambulatory Visit: Payer: Self-pay

## 2019-04-23 ENCOUNTER — Emergency Department (HOSPITAL_COMMUNITY): Payer: 59

## 2019-04-23 ENCOUNTER — Emergency Department (HOSPITAL_COMMUNITY)
Admission: EM | Admit: 2019-04-23 | Discharge: 2019-04-23 | Disposition: A | Discharge status: Home / Self Care | Payer: 59 | Attending: Emergency Medicine | Admitting: Emergency Medicine

## 2019-04-23 DIAGNOSIS — Z7984 Long term (current) use of oral hypoglycemic drugs: Secondary | ICD-10-CM | POA: Diagnosis not present

## 2019-04-23 DIAGNOSIS — R110 Nausea: Secondary | ICD-10-CM | POA: Insufficient documentation

## 2019-04-23 DIAGNOSIS — R300 Dysuria: Secondary | ICD-10-CM | POA: Diagnosis not present

## 2019-04-23 DIAGNOSIS — R42 Dizziness and giddiness: Secondary | ICD-10-CM | POA: Diagnosis not present

## 2019-04-23 DIAGNOSIS — Z79899 Other long term (current) drug therapy: Secondary | ICD-10-CM | POA: Diagnosis not present

## 2019-04-23 DIAGNOSIS — E119 Type 2 diabetes mellitus without complications: Secondary | ICD-10-CM | POA: Diagnosis not present

## 2019-04-23 DIAGNOSIS — N281 Cyst of kidney, acquired: Secondary | ICD-10-CM | POA: Diagnosis not present

## 2019-04-23 DIAGNOSIS — N3001 Acute cystitis with hematuria: Secondary | ICD-10-CM | POA: Insufficient documentation

## 2019-04-23 DIAGNOSIS — R1013 Epigastric pain: Secondary | ICD-10-CM | POA: Diagnosis not present

## 2019-04-23 LAB — COMPREHENSIVE METABOLIC PANEL
ALT: 28 U/L (ref 0–44)
AST: 36 U/L (ref 15–41)
Albumin: 4.5 g/dL (ref 3.5–5.0)
Alkaline Phosphatase: 74 U/L (ref 38–126)
Anion gap: 15 (ref 5–15)
BUN: 17 mg/dL (ref 6–20)
CO2: 19 mmol/L — ABNORMAL LOW (ref 22–32)
Calcium: 10.1 mg/dL (ref 8.9–10.3)
Chloride: 102 mmol/L (ref 98–111)
Creatinine, Ser: 0.91 mg/dL (ref 0.44–1.00)
GFR calc Af Amer: >60 mL/min (ref >60)
GFR calc non Af Amer: >60 mL/min (ref >60)
Glucose, Bld: 272 mg/dL — ABNORMAL HIGH (ref 70–99)
Potassium: 3.7 mmol/L (ref 3.5–5.1)
Sodium: 136 mmol/L (ref 135–145)
Total Bilirubin: 0.5 mg/dL (ref 0.3–1.2)
Total Protein: 8.1 g/dL (ref 6.5–8.1)

## 2019-04-23 LAB — CBC WITH DIFFERENTIAL/PLATELET
Abs Immature Granulocytes: 0.01 10*3/uL (ref 0.00–0.07)
Basophils Absolute: 0.1 10*3/uL (ref 0.0–0.1)
Basophils Relative: 1 %
Eosinophils Absolute: 0.3 10*3/uL (ref 0.0–0.5)
Eosinophils Relative: 4 %
HCT: 41.5 % (ref 36.0–46.0)
Hemoglobin: 14.2 g/dL (ref 12.0–15.0)
Immature Granulocytes: 0 %
Lymphocytes Relative: 39 %
Lymphs Abs: 2.6 10*3/uL (ref 0.7–4.0)
MCH: 29.8 pg (ref 26.0–34.0)
MCHC: 34.2 g/dL (ref 30.0–36.0)
MCV: 87.2 fL (ref 80.0–100.0)
Monocytes Absolute: 0.4 10*3/uL (ref 0.1–1.0)
Monocytes Relative: 7 %
Neutro Abs: 3.3 10*3/uL (ref 1.7–7.7)
Neutrophils Relative %: 49 %
Platelets: 380 10*3/uL (ref 150–400)
RBC: 4.76 MIL/uL (ref 3.87–5.11)
RDW: 13 % (ref 11.5–15.5)
WBC: 6.6 10*3/uL (ref 4.0–10.5)
nRBC: 0 % (ref 0.0–0.2)

## 2019-04-23 LAB — URINALYSIS, ROUTINE W REFLEX MICROSCOPIC
Bilirubin Urine: NEGATIVE
Glucose, UA: 150 mg/dL — AB
Ketones, ur: 5 mg/dL — AB
Nitrite: NEGATIVE
Protein, ur: 30 mg/dL — AB
RBC / HPF: >50 RBC/hpf — ABNORMAL HIGH (ref 0–5)
Specific Gravity, Urine: 1.029 (ref 1.005–1.030)
pH: 5 (ref 5.0–8.0)

## 2019-04-23 LAB — CBG MONITORING, ED: Glucose-Capillary: 259 mg/dL — ABNORMAL HIGH (ref 70–99)

## 2019-04-23 LAB — LIPASE, BLOOD: Lipase: 52 U/L — ABNORMAL HIGH (ref 11–51)

## 2019-04-23 LAB — TROPONIN I: Troponin I: <0.03 ng/mL (ref <0.03)

## 2019-04-23 LAB — MAGNESIUM: Magnesium: 1.4 mg/dL — ABNORMAL LOW (ref 1.7–2.4)

## 2019-04-23 MED ORDER — SODIUM CHLORIDE 0.9 % IV BOLUS
1000.0000 mL | Freq: Once | INTRAVENOUS | Status: AC
  Administered 2019-04-23: 1000 mL via INTRAVENOUS

## 2019-04-23 MED ORDER — CEPHALEXIN 500 MG PO CAPS: 500.0000 mg | ORAL_CAPSULE | Freq: Two times a day (BID) | ORAL | 0 refills | Status: AC

## 2019-04-23 MED ORDER — IOHEXOL 300 MG/ML  SOLN
100.0000 mL | Freq: Once | INTRAMUSCULAR | Status: AC | PRN
  Administered 2019-04-23: 100 mL via INTRAVENOUS

## 2019-04-23 MED ORDER — LIDOCAINE VISCOUS HCL 2 % MT SOLN
15.0000 mL | Freq: Once | OROMUCOSAL | Status: AC
Start: 2019-04-23 — End: 2019-04-23
  Administered 2019-04-23: 15 mL via ORAL
  Filled 2019-04-23: qty 15

## 2019-04-23 MED ORDER — ALUM & MAG HYDROXIDE-SIMETH 200-200-20 MG/5ML PO SUSP
30.0000 mL | Freq: Once | ORAL | Status: AC
  Administered 2019-04-23: 30 mL via ORAL
  Filled 2019-04-23: qty 30

## 2019-04-23 MED ORDER — MECLIZINE HCL 25 MG PO TABS: 25.0000 mg | ORAL_TABLET | Freq: Three times a day (TID) | ORAL | 0 refills | Status: AC | PRN

## 2019-04-23 MED ORDER — MECLIZINE HCL 25 MG PO TABS
25.0000 mg | ORAL_TABLET | Freq: Once | ORAL | Status: AC
  Administered 2019-04-23: 25 mg via ORAL
  Filled 2019-04-23: qty 1

## 2019-04-23 MED ORDER — DIAZEPAM 2 MG PO TABS
2.0000 mg | ORAL_TABLET | Freq: Once | ORAL | Status: AC
  Administered 2019-04-23: 2 mg via ORAL
  Filled 2019-04-23: qty 1

## 2019-04-23 NOTE — ED Notes (Signed)
Patient verbalizes understanding of discharge instructions. Opportunity for questioning and answers were provided. Armband removed by staff, pt discharged from ED with scripts sent to pharmacy.

## 2019-04-23 NOTE — ED Provider Notes (Signed)
[facility] EMERGENCY DEPARTMENT Provider Note   CSN: [id number] Arrival date & time: 04/23/19  1114  History   Chief Complaint Chief Complaint  Patient presents with   Dizziness   Emesis   Abdominal Pain   HPI Carly Maxwell is a 58 y.o. female with past medical history significant for diabetes who presents for evaluation of dizziness.  Patient states she was standing at work and felt a sudden onset of dizziness which she describes as "like the room was spinning."  Patient states she got extremely lightheaded and nauseous.  Stated that she was not moving when this occurred.  Denies lasted approximately 2 minutes and self resolved.  Patient states she has had some persistent nausea since the incident.  Patient states she has had epigastric abdominal pain x2 days.  She denies alleviating or aggravating factors.  States she has some burning with urination, however states this is chronic in nature.  She was seen by her PCP and was told this had to do with vaginal atrophy.  Patient denies sudden onset thunderclap headache, facial asymmetry, slurred speech, unilateral weakness, neck pain, neck stiffness, chest pain, shortness of breath, cough, diarrhea or constipation.  She has been able to tolerate p.o. intake at home without difficulty prior to incident.  She denies prior history of vertigo.  She describes her abdominal pain as cramping sensation to her epigastric region.  She denies radiation of pain to her back, alcohol use, NSAID use.  Pain is not worse with food intake.  Has not taken anything for pain.  She rates her pain a 5/10.  Pain does not radiate.  History obtained from patient.  No interpreter was used.   Prior ABD sugeries-- Lap cystectomy ("years ago") Hysterectomy Last PO intake 1000  PCP- No PCP, Sees endo for DM     HPI  Past Medical History:  Diagnosis Date   Diabetes mellitus without complication [facility])     Patient Active Problem List   Diagnosis Date Noted   Uncontrolled type 2 diabetes mellitus without complication, with long-term current use of insulin (HCC) 04/29/2018    Past Surgical History:  Procedure Laterality Date   ABDOMINAL HYSTERECTOMY     arm surgery     CATARACT EXTRACTION       OB History   No obstetric history on file.      Home Medications    Prior to Admission medications   Medication Sig Start Date End Date Taking? Authorizing Provider  dorzolamide-timolol (COSOPT) 22.3-6.8 MG/ML ophthalmic solution Place 1 drop into both eyes 2 (two) times daily.   Yes [provider]  hydrochlorothiazide (MICROZIDE) 12.5 MG capsule Take 12.5 mg by mouth daily.   Yes [provider]  insulin aspart protamine - aspart (NOVOLOG MIX 70/30 FLEXPEN) (70-30) 100 UNIT/ML FlexPen Inject 18 Units into the skin 2 (two) times daily with a meal.  01/03/19  Yes [provider]  lisinopril (PRINIVIL,ZESTRIL) 5 MG tablet Take 5 mg by mouth daily.   Yes [provider]  metFORMIN (GLUCOPHAGE) 1000 MG tablet Take 1 tablet (1,000 mg total) by mouth 2 (two) times daily with a meal. 03/03/18  Yes Roxy Horseman, PA-C  travoprost, benzalkonium, (TRAVATAN) 0.004 % ophthalmic solution Place 1 drop into both eyes at bedtime.   Yes [provider]  cephALEXin (KEFLEX) 500 MG capsule Take 1 capsule (500 mg total) by mouth 2 (two) times daily for 5 days. 04/23/19 04/28/19  Cairo Agostinelli A, PA-C  meclizine (ANTIVERT)  25 MG tablet Take 1 tablet (25 mg total) by mouth 3 (three) times daily as needed for dizziness. 04/23/19   Liahm Grivas A, PA-C  ondansetron (ZOFRAN ODT) 4 MG disintegrating tablet Take 1 tablet (4 mg total) by mouth every 8 (eight) hours as needed for nausea or vomiting. Patient not taking: Reported on 04/23/2019 06/17/18   McDonald, Pedro Earls A, PA-C  oxyCODONE-acetaminophen (PERCOCET/ROXICET) 5-325 MG tablet Take 1 tablet by mouth every 8 (eight) hours as needed for severe  pain. Patient not taking: Reported on 01/03/2019 06/17/18   Barkley Boards, PA-C    Family History No family history on file.  Social History Social History   Tobacco Use   Smoking status: Never Smoker   Smokeless tobacco: Never Used  Substance Use Topics   Alcohol use: No    Frequency: Never   Drug use: No     Allergies   Lantus [insulin glargine]   Review of Systems Review of Systems  Constitutional: Negative.   HENT: Negative.   Eyes: Negative.   Respiratory: Negative.   Cardiovascular: Negative.   Gastrointestinal: Positive for abdominal pain and nausea. Negative for anal bleeding, blood in stool, constipation, diarrhea, rectal pain and vomiting.  Genitourinary: Positive for dysuria. Negative for decreased urine volume, difficulty urinating, flank pain, frequency, hematuria, pelvic pain, urgency and vaginal bleeding.  Musculoskeletal: Negative.   Skin: Negative.   Neurological: Positive for dizziness. Negative for tremors, seizures, syncope, facial asymmetry, speech difficulty, weakness, light-headedness, numbness and headaches.  All other systems reviewed and are negative.    Physical Exam Updated Vital Signs BP 137/70    Pulse 87    Temp 98 F (36.7 C) (Oral)    Resp 17    Ht  (1.6 m)    Wt 74.8 kg    SpO2 100%    BMI 29.23 kg/m   Physical Exam  Physical Exam  Constitutional: Pt is oriented to person, place, and time. Pt appears well-developed and well-nourished. No distress.  HENT:  Head: Normocephalic and atraumatic.  Mouth/Throat: Oropharynx is clear and moist.  Eyes: Conjunctivae and EOM are normal. Pupils are equal, round, and reactive to light. No scleral icterus.  No vertical or rotational nystagmus  1 beat horizontal nystagmus to the left. Neck: Normal range of motion. Neck supple.  Full active and passive ROM without pain No midline or paraspinal tenderness No nuchal rigidity or meningeal signs  Cardiovascular: Normal rate, regular  rhythm and intact distal pulses.   Pulmonary/Chest: Effort normal and breath sounds normal. No respiratory distress. Pt has no wheezes. No rales.  Abdominal: Soft. Bowel sounds are normal.  Mild generalized tenderness to epigastric region.  Negative Murphy sign. There is no rebound and no guarding.  Musculoskeletal: Normal range of motion.  Lymphadenopathy:    No cervical adenopathy.  Neurological: Pt. is alert and oriented to person, place, and time. He has normal reflexes. No cranial nerve deficit.  Exhibits normal muscle tone. Coordination normal.  Mental Status:  Alert, oriented, thought content appropriate. Speech fluent without evidence of aphasia. Able to follow 2 step commands without difficulty.  Cranial Nerves:  II:  Peripheral visual fields grossly normal, pupils equal, round, reactive to light III,IV, VI: ptosis not present, extra-ocular motions intact bilaterally  V,VII: smile symmetric, facial light touch sensation equal VIII: hearing grossly normal bilaterally  IX,X: midline uvula rise  XI: bilateral shoulder shrug equal and strong XII: midline tongue extension  Motor:  5/5 in upper and lower extremities bilaterally  including strong and equal grip strength and dorsiflexion/plantar flexion Sensory: Pinprick and light touch normal in all extremities.  Deep Tendon Reflexes: 2+ and symmetric  Cerebellar: normal finger-to-nose with bilateral upper extremities, Negative heel-to-shin, Romberg Gait: normal gait and balance CV: distal pulses palpable throughout   Skin: Skin is warm and dry. No rash noted. Pt is not diaphoretic.  Psychiatric: Pt has a normal mood and affect. Behavior is normal. Judgment and thought content normal.  Nursing note and vitals reviewed. ED Treatments / Results  Labs (all labs ordered are listed, but only abnormal results are displayed) Labs Reviewed  COMPREHENSIVE METABOLIC PANEL - Abnormal; Notable for the following components:      Result Value    CO2 19 (*)    Glucose, Bld 272 (*)    All other components within normal limits  LIPASE, BLOOD - Abnormal; Notable for the following components:   Lipase 52 (*)    All other components within normal limits  MAGNESIUM - Abnormal; Notable for the following components:   Magnesium 1.4 (*)    All other components within normal limits  URINALYSIS, ROUTINE W REFLEX MICROSCOPIC - Abnormal; Notable for the following components:   Color, Urine AMBER (*)    APPearance CLOUDY (*)    Glucose, UA 150 (*)    Hgb urine dipstick MODERATE (*)    Ketones, ur 5 (*)    Protein, ur 30 (*)    Leukocytes,Ua SMALL (*)    RBC / HPF >50 (*)    Bacteria, UA MANY (*)    All other components within normal limits  CBG MONITORING, ED - Abnormal; Notable for the following components:   Glucose-Capillary 259 (*)    All other components within normal limits  URINE CULTURE  CBC WITH DIFFERENTIAL/PLATELET  TROPONIN I    EKG EKG Interpretation  Date/Time:  Saturday Apr 23 2019 11:31:17 EDT Ventricular Rate:  96 PR Interval:    QRS Duration: 85 QT Interval:  367 QTC Calculation: 467 R Axis:   83 Text Interpretation:  Sinus rhythm Minimal ST depression, inferior leads No old tracing to compare Non-specific ST changes in inferior leads, no old to compare  Confirmed by Shaune PollackIsaacs, Cameron [phone]([id number]) on 04/23/2019 11:34:01 AM   Radiology Ct Abdomen Pelvis W Contrast  Result Date: 04/23/2019 CLINICAL DATA:  Pancreatitis. EXAM: CT ABDOMEN AND PELVIS WITH CONTRAST TECHNIQUE: Multidetector CT imaging of the abdomen and pelvis was performed using the standard protocol following bolus administration of intravenous contrast. CONTRAST:  100mL OMNIPAQUE IOHEXOL 300 MG/ML  SOLN COMPARISON:  06/17/2018 FINDINGS: Lower chest: No acute abnormality. Hepatobiliary: No focal hepatic mass. Low attenuation of the liver as can be seen with hepatic steatosis. Prior cholecystectomy. No intrahepatic or extrahepatic biliary ductal dilatation.  Pancreas: Unremarkable. No pancreatic ductal dilatation or surrounding inflammatory changes. Spleen: Normal in size without focal abnormality. Adrenals/Urinary Tract: Normal adrenal glands. Normal right kidney. Stable 3.9 cm left renal cyst. No urolithiasis or obstructive uropathy. Normal bladder. Stomach/Bowel: Stomach is within normal limits. Appendix appears normal. No evidence of bowel wall thickening, distention, or inflammatory changes. Vascular/Lymphatic: No significant vascular findings are present. No enlarged abdominal or pelvic lymph nodes. Reproductive: Status post hysterectomy. No adnexal masses. Other: Small fat containing umbilical hernia. No abdominopelvic ascites. Musculoskeletal: No acute osseous abnormality. No aggressive osseous lesion. IMPRESSION: 1. No acute abdominal or pelvic pathology. Electronically Signed   By: Elige KoHetal  Patel   On: 04/23/2019 14:21    Procedures Procedures (including critical care time)  Medications  Ordered in ED Medications  sodium chloride 0.9 % bolus 1,000 mL (1,000 mLs Intravenous New Bag/Given 04/23/19 1155)  meclizine (ANTIVERT) tablet 25 mg (25 mg Oral Given 04/23/19 1155)  diazepam (VALIUM) tablet 2 mg (2 mg Oral Given 04/23/19 1455)  iohexol (OMNIPAQUE) 300 MG/ML solution 100 mL (100 mLs Intravenous Contrast Given 04/23/19 1344)  alum & mag hydroxide-simeth (MAALOX/MYLANTA) 200-200-20 MG/5ML suspension 30 mL (30 mLs Oral Given 04/23/19 1455)    And  lidocaine (XYLOCAINE) 2 % viscous mouth solution 15 mL (15 mLs Oral Given 04/23/19 1455)    Initial Impression / Assessment and Plan / ED Course  I have reviewed the triage vital signs and the nursing notes.  Pertinent labs & imaging results that were available during my care of the patient were reviewed by me and considered in my medical decision making (see chart for details).  58 year old female appears otherwise well presents for evaluation of dizziness.  Afebrile, nonseptic, non-ill-appearing.  Patient  describes episode of dizziness while standing still which started as a sudden "room spinning."  Patient also had nausea with episode.  Denies history of vertigo.  Episode lasted approximately 1-2 minutes.  No sudden onset thunderclap headache.  No preceding chest pain, shortness of breath.  Normal neurologic exam without focal neurologic deficits.  She does have 1 beat of horizontal nystagmus to the left.  She has negative heel-to-shin, Romberg, finger-to-nose.  She denies any current dizziness, however states she does feel nauseous.  Has had abdominal pain to the epigastric region x2 days.  Previous lap chole.  Denies history of pancreatitis, alcohol use or NSAID use.  Also has some burning with urination, however states this is chronic was related to vaginal atrophy.  Been able to tolerate p.o. intake at home without difficulty.  On evaluation her abdomen is soft with generalized tenderness to the epigastric region without rebound or guarding.  She has negative Murphy sign.  She has a nonsurgical abdomen.  Chest wall nontender palpation.  Heart without murmurs, rubs or gallops.  Lungs clear to auscultation bilateral without wheeze, rhonchi or rales.  Patient does not have any tachypnea, tachycardia or hypoxia.  Feel dizziness and abdominal pain are possibly 2 separate issues.  Dizziness seems related to peripheral vertigo.  Will give medication, IV fluids and reevaluate.  1315: On reevaluation patient states she now has no dizziness just sitting still.  Nausea relieved with meclizine. Will order MR brain WO to assess for central cause of vertigo. Abd tender to epigastric region. Will order CT abd.  Labs and imaging personally reviewed: CBC: without leukocytosis, hemoglobin 14.2 Magnesium 1.4 CMP: Hyperglycemia at 272, CO2 19, no anion gap, low suspicion for DKA. IVF given. Lipase 52, low suspicion for duct stone as patient had lap chole many years ago.  Her LFTs are not elevated to suggest biliary  pathology. CT without evidence of inflammation around pancreas or other AP pathology. Troponin negative  EKG without STEMI Urinalysis with small leuks and many bacteria. Urine culture sent.  1530: ReEval--Patient without any dizziness, nausea or abdominal pain.  Patient pending MRI at this time.  Patient with unilateral nystagmus, negative skew test and normal neurologic exam.  No vertical or rotational nystagmus. Patient normal finger-nose and normal gait.  No slurred speech renal or weakness.  Doubt CVA or other central cause of vertigo.  History and physical consistent with peripheral vertigo symptoms.  Plan for MR brain. If MR negative plan to dc home with Meclazine. Patient is nontoxic, nonseptic appearing,  in no apparent distress.  Patient's pain and other symptoms adequately managed in emergency department.  Patient does not meet the SIRS or Sepsis criteria.  On repeat exam patient does not have a surgical abdomin and there are no peritoneal signs.  No indication of appendicitis, bowel obstruction, bowel perforation, cholecystitis, diverticulitis, PID or ectopic pregnancy.     Care transferred to [facility], PA-C who will determine ultimate, treatment, plan and disposition.  Patient pending MR brain at shift transfer.     Final Clinical Impressions(s) / ED Diagnoses   Final diagnoses:  Dizziness  Acute cystitis with hematuria    ED Discharge Orders         Ordered    cephALEXin (KEFLEX) 500 MG capsule  2 times daily     04/23/19 1546    meclizine (ANTIVERT) 25 MG tablet  3 times daily PRN     04/23/19 1546           Mikko Lewellen A, PA-C 04/23/19 1548    Shaune Pollack, MD 04/24/19 [phone]

## 2019-04-23 NOTE — ED Triage Notes (Signed)
Pt came in from work with c/o nausea and abd pain.  Also c/o dizziness.  Pt is A&Ox4.  Pt placed in gown and IV initiated.  Pt placed on cardiac monitor as well

## 2019-04-23 NOTE — ED Provider Notes (Signed)
Care assumed from [facility], PA-C at shift change with MRI brain pending.   In brief, this patient is a 58 y.o. F who presented with evaluation of acute dizziness that began earlier today while standing up at work.  Patient states she fell like the room was spinning.  She also reported some associated nausea.  She states that symptoms felt like room was spinning and lasted approximately 1 to 2 minutes.  She states that dizziness improved but nausea persisted.  Additionally, patient has reported some 2-day history of epigastric abdominal pain with nausea no vomiting.  Please see note from previous provider for full history/physical exam.     Physical Exam  BP 127/76   Pulse 80   Temp 98 F (36.7 C) (Oral)   Resp 17   Ht 5\' 3"  (1.6 m)   Wt 74.8 kg   SpO2 100%   BMI 29.23 kg/m   Physical Exam   Normal gait. No ataxia.  Abdomen is soft, non-distended. Mild tenderness in the epigastric region. No peritoneal signs.    ED Course/Procedures     Procedures  MDM   PLAN: Labs unremarkable.  CT head was unremarkable.  Patient had some improvement but then reports that symptoms persisted.  Patient is pending MRI of brain for evaluation of possible CVA etiology.  If MRI is normal, plan to go home.  MDM:  MRI shows no acute intracranial abnormality.  There is mention of mild chronic small vessel ischemic disease.  Discussed results with patient, including chronic small vessel ischemic disease.  Patient states she feels better.  She reports still having some mild abdominal tenderness but states she has not had any vomiting or nausea.  Personally ambulated patient in the room with no signs of gait ataxia.  She denied any dizziness.  Vitals are stable. At this time, patient exhibits no emergent life-threatening condition that require further evaluation in ED or admission. Patient had ample opportunity for questions and discussion. All patient's questions were answered with full  understanding.  Portions of this note were generated with Scientist, clinical (histocompatibility and immunogenetics). Dictation errors may occur despite best attempts at proofreading.   1. Dizziness   2. Acute cystitis with hematuria   3. Renal cyst        Rosana Hoes 04/23/19 2329    Gerhard Munch, MD 04/24/19 1645    Gerhard Munch, MD 04/24/19 [phone]

## 2019-04-23 NOTE — Discharge Instructions (Addendum)
Evaluated today for dizziness.  A new prescription for meclizine, a similar medication you are given the emergency department.  Please make sure to follow-up with PCP for reevaluation if you continue to have symptoms.  Your MRI today was reassuring.  Additionally, your CT abdomen pelvis was reassuring.  There was mention of a left renal cyst.  Review of records show that this is been seen previously.  This is most likely not contributing to her symptoms.  Urine did show possible bacteria.  We have cultured this.  I have a new prescription for Keflex.  If your urine culture does grow bacteria you will be called and told to stop taking this medicine.  Return to the emergency department for any difficulty walking, numbness/weakness of your arms or legs, chest pain, dizziness or any other worsening concerning symptoms.

## 2019-04-24 LAB — URINE CULTURE

## 2019-05-09 ENCOUNTER — Encounter: Payer: Self-pay | Admitting: Podiatry

## 2019-05-09 ENCOUNTER — Ambulatory Visit: Payer: 59 | Admitting: Podiatry

## 2019-05-09 ENCOUNTER — Other Ambulatory Visit: Payer: Self-pay

## 2019-05-09 VITALS — Temp 97.5°F

## 2019-05-09 DIAGNOSIS — E119 Type 2 diabetes mellitus without complications: Secondary | ICD-10-CM

## 2019-05-09 DIAGNOSIS — M79675 Pain in left toe(s): Secondary | ICD-10-CM | POA: Diagnosis not present

## 2019-05-09 DIAGNOSIS — L84 Corns and callosities: Secondary | ICD-10-CM | POA: Diagnosis not present

## 2019-05-09 DIAGNOSIS — M79674 Pain in right toe(s): Secondary | ICD-10-CM

## 2019-05-09 DIAGNOSIS — B351 Tinea unguium: Secondary | ICD-10-CM | POA: Diagnosis not present

## 2019-05-09 NOTE — Patient Instructions (Signed)
Diabetes Mellitus and Foot Care  Foot care is an important part of your health, especially when you have diabetes. Diabetes may cause you to have problems because of poor blood flow (circulation) to your feet and legs, which can cause your skin to:   Become thinner and drier.   Break more easily.   Heal more slowly.   Peel and crack.  You may also have nerve damage (neuropathy) in your legs and feet, causing decreased feeling in them. This means that you may not notice minor injuries to your feet that could lead to more serious problems. Noticing and addressing any potential problems early is the best way to prevent future foot problems.  How to care for your feet  Foot hygiene   Wash your feet daily with warm water and mild soap. Do not use hot water. Then, pat your feet and the areas between your toes until they are completely dry. Do not soak your feet as this can dry your skin.   Trim your toenails straight across. Do not dig under them or around the cuticle. File the edges of your nails with an emery board or nail file.   Apply a moisturizing lotion or petroleum jelly to the skin on your feet and to dry, brittle toenails. Use lotion that does not contain alcohol and is unscented. Do not apply lotion between your toes.  Shoes and socks   Wear clean socks or stockings every day. Make sure they are not too tight. Do not wear knee-high stockings since they may decrease blood flow to your legs.   Wear shoes that fit properly and have enough cushioning. Always look in your shoes before you put them on to be sure there are no objects inside.   To break in new shoes, wear them for just a few hours a day. This prevents injuries on your feet.  Wounds, scrapes, corns, and calluses   Check your feet daily for blisters, cuts, bruises, sores, and redness. If you cannot see the bottom of your feet, use a mirror or ask someone for help.   Do not cut corns or calluses or try to remove them with medicine.   If you  find a minor scrape, cut, or break in the skin on your feet, keep it and the skin around it clean and dry. You may clean these areas with mild soap and water. Do not clean the area with peroxide, alcohol, or iodine.   If you have a wound, scrape, corn, or callus on your foot, look at it several times a day to make sure it is healing and not infected. Check for:  ? Redness, swelling, or pain.  ? Fluid or blood.  ? Warmth.  ? Pus or a bad smell.  General instructions   Do not cross your legs. This may decrease blood flow to your feet.   Do not use heating pads or hot water bottles on your feet. They may burn your skin. If you have lost feeling in your feet or legs, you may not know this is happening until it is too late.   Protect your feet from hot and cold by wearing shoes, such as at the beach or on hot pavement.   Schedule a complete foot exam at least once a year (annually) or more often if you have foot problems. If you have foot problems, report any cuts, sores, or bruises to your health care provider immediately.  Contact a health care provider if:     You have a medical condition that increases your risk of infection and you have any cuts, sores, or bruises on your feet.   You have an injury that is not healing.   You have redness on your legs or feet.   You feel burning or tingling in your legs or feet.   You have pain or cramps in your legs and feet.   Your legs or feet are numb.   Your feet always feel cold.   You have pain around a toenail.  Get help right away if:   You have a wound, scrape, corn, or callus on your foot and:  ? You have pain, swelling, or redness that gets worse.  ? You have fluid or blood coming from the wound, scrape, corn, or callus.  ? Your wound, scrape, corn, or callus feels warm to the touch.  ? You have pus or a bad smell coming from the wound, scrape, corn, or callus.  ? You have a fever.  ? You have a red line going up your leg.  Summary   Check your feet every day  for cuts, sores, red spots, swelling, and blisters.   Moisturize feet and legs daily.   Wear shoes that fit properly and have enough cushioning.   If you have foot problems, report any cuts, sores, or bruises to your health care provider immediately.   Schedule a complete foot exam at least once a year (annually) or more often if you have foot problems.  This information is not intended to replace advice given to you by your health care provider. Make sure you discuss any questions you have with your health care provider.  Document Released: 12/05/2000 Document Revised: 01/20/2018 Document Reviewed: 01/09/2017  [employer]vier Interactive Patient Education  2019 [employer]

## 2019-05-16 ENCOUNTER — Encounter: Payer: Self-pay | Admitting: Podiatry

## 2019-05-16 NOTE — Progress Notes (Signed)
Subjective: Carly Maxwell is a 58 y.o. y.o. female who presents for preventative diabetic foot care on  today with cc of painful, discolored, thick toenails b/l. She also has  and painful callus/corn which interfere with daily activities. Pain is aggravated when wearing enclosed shoe gear and relieved with periodic professional debridement.   Current Outpatient Medications:  .  Continuous Blood Gluc Sensor (FREESTYLE LIBRE 14 DAY SENSOR) MISC, USE 1 DEVICE Q 14 DAYS, Disp: , Rfl:  .  dorzolamide-timolol (COSOPT) 22.3-6.8 MG/ML ophthalmic solution, Place 1 drop into both eyes 2 (two) times daily., Disp: , Rfl:  .  hydrochlorothiazide (MICROZIDE) 12.5 MG capsule, Take 12.5 mg by mouth daily., Disp: , Rfl:  .  insulin aspart protamine - aspart (NOVOLOG MIX 70/30 FLEXPEN) (70-30) 100 UNIT/ML FlexPen, Inject 18 Units into the skin 2 (two) times daily with a meal. , Disp: , Rfl:  .  lisinopril (PRINIVIL,ZESTRIL) 5 MG tablet, Take 5 mg by mouth daily., Disp: , Rfl:  .  losartan (COZAAR) 50 MG tablet, TK 1 T PO QD, Disp: , Rfl:  .  losartan-hydrochlorothiazide (HYZAAR) 50-12.5 MG tablet, , Disp: , Rfl:  .  meclizine (ANTIVERT) 25 MG tablet, Take 1 tablet (25 mg total) by mouth 3 (three) times daily as needed for dizziness., Disp: 30 tablet, Rfl: 0 .  metFORMIN (GLUCOPHAGE) 1000 MG tablet, Take 1 tablet (1,000 mg total) by mouth 2 (two) times daily with a meal., Disp: 60 tablet, Rfl: 0 .  ondansetron (ZOFRAN ODT) 4 MG disintegrating tablet, Take 1 tablet (4 mg total) by mouth every 8 (eight) hours as needed for nausea or vomiting., Disp: 20 tablet, Rfl: 0 .  oxyCODONE-acetaminophen (PERCOCET/ROXICET) 5-325 MG tablet, Take 1 tablet by mouth every 8 (eight) hours as needed for severe pain., Disp: 10 tablet, Rfl: 0 .  travoprost, benzalkonium, (TRAVATAN) 0.004 % ophthalmic solution, Place 1 drop into both eyes at bedtime., Disp: , Rfl:   Allergies  Allergen Reactions  . Lantus [Insulin Glargine] Hives and  Cough    Objective: Vitals:   05/09/19 1501  Temp: (!) 97.5 F (36.4 C)   Vascular Examination: Capillary refill time immediate x 10 digits.  Dorsalis pedis pulses present b/l.  Posterior tibial pulses present b/l.  Digital hair present x 10 digits.  Skin temperature gradient WNL b/l.  Dermatological Examination: Skin with normal turgor, texture and tone b/l.  Toenails 1-5 b/l discolored, thick, dystrophic with subungual debris and pain with palpation to nailbeds due to thickness of nails.  Hyperkeratotic lesion plantarmedial hallux b/l. No erythema, no edema, no drainage, no flocculence noted.   Musculoskeletal: Muscle strength 5/5 to all LE muscle groups  Neurological: Sensation intact 5/5 b/l with 10 gram monofilament.  Vibratory sensation intact.  Assessment: 1. Painful onychomycosis toenails 1-5 b/l 2.   Callus b/l hallux 3.   NIDDM  Plan: 1. Continue diabetic foot care principles. Literature dispensed on today. 2. Toenails 1-5 b/l were debrided in length and girth without iatrogenic bleeding. 3. Hyperkeratotic lesion(s) b/l hallux pared with sterile scalpel blade without incident. 4. Patient to continue soft, supportive shoe gear daily. 5. Patient to report any pedal injuries to medical professional immediately. 6. Follow up 3 months.  7. Patient/POA to call should there be a concern in the interim.

## 2019-08-08 ENCOUNTER — Other Ambulatory Visit: Payer: Self-pay

## 2019-08-08 ENCOUNTER — Encounter: Payer: Self-pay | Admitting: Podiatry

## 2019-08-08 ENCOUNTER — Ambulatory Visit: Payer: 59 | Admitting: Podiatry

## 2019-08-08 VITALS — Temp 97.2°F

## 2019-08-08 DIAGNOSIS — B351 Tinea unguium: Secondary | ICD-10-CM

## 2019-08-08 DIAGNOSIS — M79674 Pain in right toe(s): Secondary | ICD-10-CM

## 2019-08-08 DIAGNOSIS — M79675 Pain in left toe(s): Secondary | ICD-10-CM

## 2019-08-08 DIAGNOSIS — E119 Type 2 diabetes mellitus without complications: Secondary | ICD-10-CM | POA: Diagnosis not present

## 2019-08-08 DIAGNOSIS — L84 Corns and callosities: Secondary | ICD-10-CM | POA: Diagnosis not present

## 2019-08-08 NOTE — Patient Instructions (Signed)
Diabetes Mellitus and Foot Care Foot care is an important part of your health, especially when you have diabetes. Diabetes may cause you to have problems because of poor blood flow (circulation) to your feet and legs, which can cause your skin to:  Become thinner and drier.  Break more easily.  Heal more slowly.  Peel and crack. You may also have nerve damage (neuropathy) in your legs and feet, causing decreased feeling in them. This means that you may not notice minor injuries to your feet that could lead to more serious problems. Noticing and addressing any potential problems early is the best way to prevent future foot problems. How to care for your feet Foot hygiene  Wash your feet daily with warm water and mild soap. Do not use hot water. Then, pat your feet and the areas between your toes until they are completely dry. Do not soak your feet as this can dry your skin.  Trim your toenails straight across. Do not dig under them or around the cuticle. File the edges of your nails with an emery board or nail file.  Apply a moisturizing lotion or petroleum jelly to the skin on your feet and to dry, brittle toenails. Use lotion that does not contain alcohol and is unscented. Do not apply lotion between your toes. Shoes and socks  Wear clean socks or stockings every day. Make sure they are not too tight. Do not wear knee-high stockings since they may decrease blood flow to your legs.  Wear shoes that fit properly and have enough cushioning. Always look in your shoes before you put them on to be sure there are no objects inside.  To break in new shoes, wear them for just a few hours a day. This prevents injuries on your feet. Wounds, scrapes, corns, and calluses  Check your feet daily for blisters, cuts, bruises, sores, and redness. If you cannot see the bottom of your feet, use a mirror or ask someone for help.  Do not cut corns or calluses or try to remove them with medicine.  If you  find a minor scrape, cut, or break in the skin on your feet, keep it and the skin around it clean and dry. You may clean these areas with mild soap and water. Do not clean the area with peroxide, alcohol, or iodine.  If you have a wound, scrape, corn, or callus on your foot, look at it several times a day to make sure it is healing and not infected. Check for: ? Redness, swelling, or pain. ? Fluid or blood. ? Warmth. ? Pus or a bad smell. General instructions  Do not cross your legs. This may decrease blood flow to your feet.  Do not use heating pads or hot water bottles on your feet. They may burn your skin. If you have lost feeling in your feet or legs, you may not know this is happening until it is too late.  Protect your feet from hot and cold by wearing shoes, such as at the beach or on hot pavement.  Schedule a complete foot exam at least once a year (annually) or more often if you have foot problems. If you have foot problems, report any cuts, sores, or bruises to your health care provider immediately. Contact a health care provider if:  You have a medical condition that increases your risk of infection and you have any cuts, sores, or bruises on your feet.  You have an injury that is not   healing.  You have redness on your legs or feet.  You feel burning or tingling in your legs or feet.  You have pain or cramps in your legs and feet.  Your legs or feet are numb.  Your feet always feel cold.  You have pain around a toenail. Get help right away if:  You have a wound, scrape, corn, or callus on your foot and: ? You have pain, swelling, or redness that gets worse. ? You have fluid or blood coming from the wound, scrape, corn, or callus. ? Your wound, scrape, corn, or callus feels warm to the touch. ? You have pus or a bad smell coming from the wound, scrape, corn, or callus. ? You have a fever. ? You have a red line going up your leg. Summary  Check your feet every day  for cuts, sores, red spots, swelling, and blisters.  Moisturize feet and legs daily.  Wear shoes that fit properly and have enough cushioning.  If you have foot problems, report any cuts, sores, or bruises to your health care provider immediately.  Schedule a complete foot exam at least once a year (annually) or more often if you have foot problems. This information is not intended to replace advice given to you by your health care provider. Make sure you discuss any questions you have with your health care provider. Document Released: 12/05/2000 Document Revised: 01/20/2018 Document Reviewed: 01/09/2017 Elsevier Patient Education  2020 [employer]   Onychomycosis/Fungal Toenails  WHAT IS IT? An infection that lies within the keratin of your nail plate that is caused by a fungus.  WHY ME? Fungal infections affect all ages, sexes, races, and creeds.  There may be many factors that predispose you to a fungal infection such as age, coexisting medical conditions such as diabetes, or an autoimmune disease; stress, medications, fatigue, genetics, etc.  Bottom line: fungus thrives in a warm, moist environment and your shoes offer such a location.  IS IT CONTAGIOUS? Theoretically, yes.  You do not want to share shoes, nail clippers or files with someone who has fungal toenails.  Walking around barefoot in the same room or sleeping in the same bed is unlikely to transfer the organism.  It is important to realize, however, that fungus can spread easily from one nail to the next on the same foot.  HOW DO WE TREAT THIS?  There are several ways to treat this condition.  Treatment may depend on many factors such as age, medications, pregnancy, liver and kidney conditions, etc.  It is best to ask your doctor which options are available to you.  1. No treatment.   Unlike many other medical concerns, you can live with this condition.  However for many people this can be a painful condition and may lead to  ingrown toenails or a bacterial infection.  It is recommended that you keep the nails cut short to help reduce the amount of fungal nail. 2. Topical treatment.  These range from herbal remedies to prescription strength nail lacquers.  About 40-50% effective, topicals require twice daily application for approximately 9 to 12 months or until an entirely new nail has grown out.  The most effective topicals are medical grade medications available through physicians offices. 3. Oral antifungal medications.  With an 80-90% cure rate, the most common oral medication requires 3 to 4 months of therapy and stays in your system for a year as the new nail grows out.  Oral antifungal medications do require   blood work to make sure it is a safe drug for you.  A liver function panel will be performed prior to starting the medication and after the first month of treatment.  It is important to have the blood work performed to avoid any harmful side effects.  In general, this medication safe but blood work is required. 4. Laser Therapy.  This treatment is performed by applying a specialized laser to the affected nail plate.  This therapy is noninvasive, fast, and non-painful.  It is not covered by insurance and is therefore, out of pocket.  The results have been very good with a 80-95% cure rate.  [facility] is the only practice in the area to offer this therapy. 5. Permanent Nail Avulsion.  Removing the entire nail so that a new nail will not grow back. 

## 2019-08-17 NOTE — Progress Notes (Signed)
Subjective: Carly Maxwell is a 58 y.o. y.o. female who presents today for preventative diabetic foot care with cc of painful, b/l 5th digits. Patient states it feels like she has an ingrown toenail in both toes. She denies any attempt at treatment.  She also presents with tender, elongated, discolored, thick toenails and painful callus/corn which interfere with daily activities. Pain is aggravated when wearing enclosed shoe gear and relieved with periodic professional debridement.    Current Outpatient Medications:  .  Insulin Degludec (TRESIBA FLEXTOUCH) 200 UNIT/ML SOPN, Inject into the skin., Disp: , Rfl:  .  Continuous Blood Gluc Sensor (FREESTYLE LIBRE 14 DAY SENSOR) MISC, USE 1 DEVICE Q 14 DAYS, Disp: , Rfl:  .  dorzolamide-timolol (COSOPT) 22.3-6.8 MG/ML ophthalmic solution, Place 1 drop into both eyes 2 (two) times daily., Disp: , Rfl:  .  glipiZIDE (GLUCOTROL XL) 2.5 MG 24 hr tablet, , Disp: , Rfl:  .  hydrochlorothiazide (MICROZIDE) 12.5 MG capsule, Take 12.5 mg by mouth daily., Disp: , Rfl:  .  insulin aspart protamine - aspart (NOVOLOG MIX 70/30 FLEXPEN) (70-30) 100 UNIT/ML FlexPen, Inject 18 Units into the skin 2 (two) times daily with a meal. , Disp: , Rfl:  .  lisinopril (PRINIVIL,ZESTRIL) 5 MG tablet, Take 5 mg by mouth daily., Disp: , Rfl:  .  losartan (COZAAR) 50 MG tablet, TK 1 T PO QD, Disp: , Rfl:  .  losartan-hydrochlorothiazide (HYZAAR) 50-12.5 MG tablet, , Disp: , Rfl:  .  meclizine (ANTIVERT) 25 MG tablet, Take 1 tablet (25 mg total) by mouth 3 (three) times daily as needed for dizziness., Disp: 30 tablet, Rfl: 0 .  metFORMIN (GLUCOPHAGE) 1000 MG tablet, Take 1 tablet (1,000 mg total) by mouth 2 (two) times daily with a meal., Disp: 60 tablet, Rfl: 0 .  ondansetron (ZOFRAN ODT) 4 MG disintegrating tablet, Take 1 tablet (4 mg total) by mouth every 8 (eight) hours as needed for nausea or vomiting., Disp: 20 tablet, Rfl: 0 .  oxyCODONE-acetaminophen (PERCOCET/ROXICET) 5-325  MG tablet, Take 1 tablet by mouth every 8 (eight) hours as needed for severe pain., Disp: 10 tablet, Rfl: 0 .  OZEMPIC, 0.25 OR 0.5 MG/DOSE, 2 MG/1.5ML SOPN, , Disp: , Rfl:  .  travoprost, benzalkonium, (TRAVATAN) 0.004 % ophthalmic solution, Place 1 drop into both eyes at bedtime., Disp: , Rfl:   Allergies  Allergen Reactions  . Lantus [Insulin Glargine] Hives and Cough    Objective: Vitals:   08/08/19 0934  Temp: (!) 97.2 F (36.2 C)    Vascular Examination: Capillary refill time immediate x 10 digits.  Dorsalis pedis pulses present b/l.  Posterior tibial pulses present b/l.  Digital hair present x 10 digits.  Skin temperature gradient WNL b/l.  Dermatological Examination: Skin with normal turgor, texture and tone b/l.  Toenails 1-5 b/l discolored, thick, dystrophic with subungual debris and pain with palpation to nailbeds due to thickness of nails.  Hyperkeratotic lesions b/l hallux and lateral nail border b/l 5th digits. No erythema, no edema, no drainage, no flocculence noted.  Musculoskeletal: Muscle strength 5/5 to all LE muscle groups.  Neurological: Sensation intact 5/5 b/l with 10 gram monofilament.  Vibratory sensation intact b/l.  Assessment: 1. Painful onychomycosis toenails 1-5 b/l 2.  Callus b/l hallux 3.  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  NIDDM  Plan: 1. Continue diabetic foot care principles. Literature dispensed on today. 2. Toenails 1-5 b/l were debrided in length and girth without iatrogenic bleeding. 3. Hyperkeratotic lesion(s) b/l  hallux and b/l 5th digits  pared with sterile scalpel blade without incident. 4. Patient to continue soft, supportive shoe gear daily. 5. Patient to report any pedal injuries to medical professional immediately. 6. Follow up 3 months.  7. Patient/POA to call should there be a concern in the interim.

## 2019-10-18 ENCOUNTER — Other Ambulatory Visit: Payer: Self-pay | Admitting: Obstetrics & Gynecology

## 2019-10-18 ENCOUNTER — Encounter: Payer: Self-pay | Admitting: Obstetrics & Gynecology

## 2019-10-18 ENCOUNTER — Other Ambulatory Visit: Payer: Self-pay

## 2019-10-18 ENCOUNTER — Ambulatory Visit (INDEPENDENT_AMBULATORY_CARE_PROVIDER_SITE_OTHER): Payer: 59 | Admitting: Obstetrics & Gynecology

## 2019-10-18 VITALS — BP 118/77 | HR 72 | Wt 162.8 lb

## 2019-10-18 DIAGNOSIS — Z1231 Encounter for screening mammogram for malignant neoplasm of breast: Secondary | ICD-10-CM

## 2019-10-18 DIAGNOSIS — N898 Other specified noninflammatory disorders of vagina: Secondary | ICD-10-CM

## 2019-10-18 DIAGNOSIS — Z01419 Encounter for gynecological examination (general) (routine) without abnormal findings: Secondary | ICD-10-CM | POA: Diagnosis not present

## 2019-10-18 DIAGNOSIS — N9089 Other specified noninflammatory disorders of vulva and perineum: Secondary | ICD-10-CM

## 2019-10-18 DIAGNOSIS — Z113 Encounter for screening for infections with a predominantly sexual mode of transmission: Secondary | ICD-10-CM

## 2019-10-18 DIAGNOSIS — N952 Postmenopausal atrophic vaginitis: Secondary | ICD-10-CM

## 2019-10-18 LAB — POCT URINE QUALITATIVE DIPSTICK BLOOD: Blood, UA: NEGATIVE

## 2019-10-18 MED ORDER — PREMARIN 0.625 MG/GM VA CREA: TOPICAL_CREAM | VAGINAL | 5 refills | Status: AC

## 2019-10-18 NOTE — Progress Notes (Signed)
GYNECOLOGY ANNUAL PREVENTATIVE CARE ENCOUNTER NOTE  History:     Carly Maxwell is a 58 y.o. PMP female here for a routine annual gynecologic exam and to establish care.  Current complaints: vulvar irritation especially during urination or during intercourse; has been going on for several weeks.  She does not feel she has a UTI; was evaluated by another provider who felt it is due to vaginal dryness.  She uses water-based lubrication as needed for intercourse. History of hysterectomy in 1994 due to benign reasons.  Denies abnormal vaginal bleeding, discharge, pelvic pain, other problems with intercourse or other gynecologic concerns.    Gynecologic History No LMP recorded. Patient has had a hysterectomy. Contraception: status post hysterectomy Last mammogram: 4-5 years ago. Results were: normal  Obstetric History OB History  No obstetric history on file.    Past Medical History:  Diagnosis Date  . Diabetes mellitus without complication [facility])     Past Surgical History:  Procedure Laterality Date  . ABDOMINAL HYSTERECTOMY    . arm surgery    . CATARACT EXTRACTION      Current Outpatient Medications on File Prior to Visit  Medication Sig Dispense Refill  . Continuous Blood Gluc Sensor (FREESTYLE LIBRE 14 DAY SENSOR) MISC USE 1 DEVICE Q 14 DAYS    . dorzolamide-timolol (COSOPT) 22.3-6.8 MG/ML ophthalmic solution Place 1 drop into both eyes 2 (two) times daily.    Marland Kitchen glipiZIDE (GLUCOTROL XL) 2.5 MG 24 hr tablet     . hydrochlorothiazide (MICROZIDE) 12.5 MG capsule Take 12.5 mg by mouth daily.    . Insulin Degludec (TRESIBA FLEXTOUCH) 200 UNIT/ML SOPN Inject into the skin.    Marland Kitchen lisinopril (PRINIVIL,ZESTRIL) 5 MG tablet Take 5 mg by mouth daily.    Marland Kitchen losartan (COZAAR) 50 MG tablet TK 1 T PO QD    . losartan-hydrochlorothiazide (HYZAAR) 50-12.5 MG tablet     . meclizine (ANTIVERT) 25 MG tablet Take 1 tablet (25 mg total) by mouth 3 (three) times daily as needed for dizziness. 30  tablet 0  . metFORMIN (GLUCOPHAGE) 1000 MG tablet Take 1 tablet (1,000 mg total) by mouth 2 (two) times daily with a meal. 60 tablet 0  . OZEMPIC, 0.25 OR 0.5 MG/DOSE, 2 MG/1.5ML SOPN     . travoprost, benzalkonium, (TRAVATAN) 0.004 % ophthalmic solution Place 1 drop into both eyes at bedtime.    . insulin aspart protamine - aspart (NOVOLOG MIX 70/30 FLEXPEN) (70-30) 100 UNIT/ML FlexPen Inject 18 Units into the skin 2 (two) times daily with a meal.     . ondansetron (ZOFRAN ODT) 4 MG disintegrating tablet Take 1 tablet (4 mg total) by mouth every 8 (eight) hours as needed for nausea or vomiting. (Patient not taking: Reported on 10/18/2019) 20 tablet 0  . oxyCODONE-acetaminophen (PERCOCET/ROXICET) 5-325 MG tablet Take 1 tablet by mouth every 8 (eight) hours as needed for severe pain. (Patient not taking: Reported on 10/18/2019) 10 tablet 0   No current facility-administered medications on file prior to visit.     Allergies  Allergen Reactions  . Lantus [Insulin Glargine] Hives and Cough    Social History:  reports that she has never smoked. She has never used smokeless tobacco. She reports that she does not drink alcohol or use drugs.  No family history on file.  The following portions of the patient's history were reviewed and updated as appropriate: allergies, current medications, past family history, past medical history, past social history, past surgical history and problem  list.  Review of Systems Pertinent items noted in HPI and remainder of comprehensive ROS otherwise negative.  Physical Exam:  BP 118/77   Pulse 72   Wt 162 lb 12.8 oz (73.8 kg)   BMI 28.84 kg/m  CONSTITUTIONAL: Well-developed, well-nourished female in no acute distress.  HENT:  Normocephalic, atraumatic, External right and left ear normal. Oropharynx is clear and moist EYES: Conjunctivae and EOM are normal. Pupils are equal, round, and reactive to light. No scleral icterus.  NECK: Normal range of motion,  supple, no masses.  Normal thyroid.  SKIN: Skin is warm and dry. No rash noted. Not diaphoretic. No erythema. No pallor. MUSCULOSKELETAL: Normal range of motion. No tenderness.  No cyanosis, clubbing, or edema.  2+ distal pulses. NEUROLOGIC: Alert and oriented to person, place, and time. Normal reflexes, muscle tone coordination. No cranial nerve deficit noted. PSYCHIATRIC: Normal mood and affect. Normal behavior. Normal judgment and thought content. CARDIOVASCULAR: Normal heart rate noted, regular rhythm RESPIRATORY: Clear to auscultation bilaterally. Effort and breath sounds normal, no problems with respiration noted. BREASTS: Symmetric in size. No masses, skin changes, nipple drainage, or lymphadenopathy. ABDOMEN: Soft, normal bowel sounds, no distention noted.  No tenderness, rebound or guarding.  PELVIC: Normal appearing external genitalia with moderate atrophy, tender to touch around inferior part of vestibule. No erythema, lesions seen.  Vaginal mucosa with moderate atrophy, well-healed cuff. Thick, white vaginal discharge noted.  Results for orders placed or performed in visit on 10/18/19 (from the past 24 hour(s))  POCT urine qual dipstick blood     Status: None   Collection Time: 10/18/19  2:24 PM  Result Value Ref Range   Blood, UA negative        Assessment and Plan:      1. Vulvar irritation 2. Postmenopausal atrophic vaginitis Likely due to atrophy, but will also follow up urine and pelvic cultures. Will do trial of topical estrogen cream for the atrophy, this will help with her discomfort during urination and intercourse.  Will reevaluate in one month. - POCT urine qual dipstick blood - Urine Culture - Cervicovaginal ancillary only - conjugated estrogens (PREMARIN) vaginal cream; Apply to affected area daily at night for two weeks, then twice a week for at least two months  Dispense: 42.5 g; Refill: 5  3. Breast cancer screening by mammogram Mammogram scheduled - MM 3D  SCREEN BREAST BILATERAL; Future  4. Well woman exam with routine gynecological exam No other concerns.   Routine preventative health maintenance measures emphasized. Please refer to After Visit Summary for other counseling recommendations.     Return in about 1 month (around 11/18/2019) for Followup atrophic vaginitis.  Verita Schneiders, MD, [place] for [employer], [place]

## 2019-10-18 NOTE — Patient Instructions (Signed)
Atrophic Vaginitis  Atrophic vaginitis is a condition in which the tissues that line the vagina become dry and thin. This condition is most common in women who have stopped having regular menstrual periods (are in menopause). This usually starts when a woman is 45-60 years old. That is the time when a woman's estrogen levels begin to drop (decrease). Estrogen is a female hormone. It helps to keep the tissues of the vagina moist. It stimulates the vagina to produce a clear fluid that lubricates the vagina for sexual intercourse. This fluid also protects the vagina from infection. Lack of estrogen can cause the lining of the vagina to get thinner and dryer. The vagina may also shrink in size. It may become less elastic. Atrophic vaginitis tends to get worse over time as a woman's estrogen level drops. What are the causes? This condition is caused by the normal drop in estrogen that happens around the time of menopause. What increases the risk? Certain conditions or situations may lower a woman's estrogen level, leading to a higher risk for atrophic vaginitis. You are more likely to develop this condition if:  You are taking medicines that block estrogen.  You have had your ovaries removed.  You are being treated for cancer with X-ray (radiation) or medicines (chemotherapy).  You have given birth or are breastfeeding.  You are older than age 50.  You smoke. What are the signs or symptoms? Symptoms of this condition include:  Pain, soreness, or bleeding during sexual intercourse (dyspareunia).  Vaginal burning, irritation, or itching.  Pain or bleeding when a speculum is used in a vaginal exam (pelvic exam).  Having burning pain when passing urine.  Vaginal discharge that is brown or yellow. In some cases, there are no symptoms. How is this diagnosed? This condition is diagnosed by taking a medical history and doing a physical exam. This will include a pelvic exam that checks the  vaginal tissues. Though rare, you may also have other tests, including:  A urine test.  A test that checks the acid balance in your vagina (acid balance test). How is this treated? Treatment for this condition depends on how severe your symptoms are. Treatment may include:  Using an over-the-counter vaginal lubricant before sex.  Using a long-acting vaginal moisturizer.  Using low-dose vaginal estrogen for moderate to severe symptoms that do not respond to other treatments. Options include creams, tablets, and inserts (vaginal rings). Before you use a vaginal estrogen, tell your health care provider if you have a history of: ? Breast cancer. ? Endometrial cancer. ? Blood clots. If you are not sexually active and your symptoms are very mild, you may not need treatment. Follow these instructions at home: Medicines  Take over-the-counter and prescription medicines only as told by your health care provider. Do not use herbal or alternative medicines unless your health care provider says that you can.  Use over-the-counter creams, lubricants, or moisturizers for dryness only as directed by your health care provider. General instructions  If your atrophic vaginitis is caused by menopause, discuss all of your menopause symptoms and treatment options with your health care provider.  Do not douche.  Do not use products that can make your vagina dry. These include: ? Scented feminine sprays. ? Scented tampons. ? Scented soaps.  Vaginal intercourse can help to improve blood flow and elasticity of vaginal tissue. If it hurts to have sex, try using a lubricant or moisturizer just before having intercourse. Contact a health care provider if:    Your discharge looks different than normal.  Your vagina has an unusual smell.  You have new symptoms.  Your symptoms do not improve with treatment.  Your symptoms get worse. Summary  Atrophic vaginitis is a condition in which the tissues that  line the vagina become dry and thin. It is most common in women who have stopped having regular menstrual periods (are in menopause).  Treatment options include using vaginal lubricants and low-dose vaginal estrogen.  Contact a health care provider if your vagina has an unusual smell, or if your symptoms get worse or do not improve after treatment. This information is not intended to replace advice given to you by your health care provider. Make sure you discuss any questions you have with your health care provider. Document Released: 04/24/2015 Document Revised: 11/20/2017 Document Reviewed: 09/03/2017 Elsevier Patient Education  2020 [employer]  

## 2019-10-19 LAB — URINE CULTURE

## 2019-10-21 LAB — CERVICOVAGINAL ANCILLARY ONLY
Candida Glabrata: NEGATIVE
Candida Vaginitis: NEGATIVE
Chlamydia: NEGATIVE
Comment: NEGATIVE
Comment: NEGATIVE
Comment: NEGATIVE
Comment: NEGATIVE
Comment: NEGATIVE
Comment: NORMAL
Neisseria Gonorrhea: NEGATIVE
Trichomonas: NEGATIVE

## 2019-11-07 ENCOUNTER — Ambulatory Visit: Payer: 59 | Admitting: Podiatry

## 2019-12-12 ENCOUNTER — Other Ambulatory Visit: Payer: Self-pay

## 2019-12-12 ENCOUNTER — Ambulatory Visit
Admission: RE | Admit: 2019-12-12 | Discharge: 2019-12-12 | Disposition: A | Discharge status: Home / Self Care | Payer: 59 | Source: Ambulatory Visit | Attending: Obstetrics & Gynecology | Admitting: Obstetrics & Gynecology

## 2019-12-12 DIAGNOSIS — Z1231 Encounter for screening mammogram for malignant neoplasm of breast: Secondary | ICD-10-CM

## 2020-05-14 ENCOUNTER — Encounter: Payer: Self-pay | Admitting: Podiatry

## 2020-05-14 ENCOUNTER — Ambulatory Visit: Payer: 59 | Admitting: Podiatry

## 2020-05-14 ENCOUNTER — Other Ambulatory Visit: Payer: Self-pay

## 2020-05-14 DIAGNOSIS — M79674 Pain in right toe(s): Secondary | ICD-10-CM

## 2020-05-14 DIAGNOSIS — L84 Corns and callosities: Secondary | ICD-10-CM | POA: Diagnosis not present

## 2020-05-14 DIAGNOSIS — E119 Type 2 diabetes mellitus without complications: Secondary | ICD-10-CM

## 2020-05-14 DIAGNOSIS — B351 Tinea unguium: Secondary | ICD-10-CM

## 2020-05-14 DIAGNOSIS — M79675 Pain in left toe(s): Secondary | ICD-10-CM | POA: Diagnosis not present

## 2020-05-14 NOTE — Patient Instructions (Signed)
Diabetes Mellitus and Foot Care Foot care is an important part of your health, especially when you have diabetes. Diabetes may cause you to have problems because of poor blood flow (circulation) to your feet and legs, which can cause your skin to:  Become thinner and drier.  Break more easily.  Heal more slowly.  Peel and crack. You may also have nerve damage (neuropathy) in your legs and feet, causing decreased feeling in them. This means that you may not notice minor injuries to your feet that could lead to more serious problems. Noticing and addressing any potential problems early is the best way to prevent future foot problems. How to care for your feet Foot hygiene  Wash your feet daily with warm water and mild soap. Do not use hot water. Then, pat your feet and the areas between your toes until they are completely dry. Do not soak your feet as this can dry your skin.  Trim your toenails straight across. Do not dig under them or around the cuticle. File the edges of your nails with an emery board or nail file.  Apply a moisturizing lotion or petroleum jelly to the skin on your feet and to dry, brittle toenails. Use lotion that does not contain alcohol and is unscented. Do not apply lotion between your toes. Shoes and socks  Wear clean socks or stockings every day. Make sure they are not too tight. Do not wear knee-high stockings since they may decrease blood flow to your legs.  Wear shoes that fit properly and have enough cushioning. Always look in your shoes before you put them on to be sure there are no objects inside.  To break in new shoes, wear them for just a few hours a day. This prevents injuries on your feet. Wounds, scrapes, corns, and calluses  Check your feet daily for blisters, cuts, bruises, sores, and redness. If you cannot see the bottom of your feet, use a mirror or ask someone for help.  Do not cut corns or calluses or try to remove them with medicine.  If you  find a minor scrape, cut, or break in the skin on your feet, keep it and the skin around it clean and dry. You may clean these areas with mild soap and water. Do not clean the area with peroxide, alcohol, or iodine.  If you have a wound, scrape, corn, or callus on your foot, look at it several times a day to make sure it is healing and not infected. Check for: ? Redness, swelling, or pain. ? Fluid or blood. ? Warmth. ? Pus or a bad smell. General instructions  Do not cross your legs. This may decrease blood flow to your feet.  Do not use heating pads or hot water bottles on your feet. They may burn your skin. If you have lost feeling in your feet or legs, you may not know this is happening until it is too late.  Protect your feet from hot and cold by wearing shoes, such as at the beach or on hot pavement.  Schedule a complete foot exam at least once a year (annually) or more often if you have foot problems. If you have foot problems, report any cuts, sores, or bruises to your health care provider immediately. Contact a health care provider if:  You have a medical condition that increases your risk of infection and you have any cuts, sores, or bruises on your feet.  You have an injury that is not   healing.  You have redness on your legs or feet.  You feel burning or tingling in your legs or feet.  You have pain or cramps in your legs and feet.  Your legs or feet are numb.  Your feet always feel cold.  You have pain around a toenail. Get help right away if:  You have a wound, scrape, corn, or callus on your foot and: ? You have pain, swelling, or redness that gets worse. ? You have fluid or blood coming from the wound, scrape, corn, or callus. ? Your wound, scrape, corn, or callus feels warm to the touch. ? You have pus or a bad smell coming from the wound, scrape, corn, or callus. ? You have a fever. ? You have a red line going up your leg. Summary  Check your feet every day  for cuts, sores, red spots, swelling, and blisters.  Moisturize feet and legs daily.  Wear shoes that fit properly and have enough cushioning.  If you have foot problems, report any cuts, sores, or bruises to your health care provider immediately.  Schedule a complete foot exam at least once a year (annually) or more often if you have foot problems. This information is not intended to replace advice given to you by your health care provider. Make sure you discuss any questions you have with your health care provider. Document Revised: 08/31/2019 Document Reviewed: 01/09/2017 Elsevier Patient Education  2020 [employer]  

## 2020-05-19 NOTE — Progress Notes (Signed)
Subjective: Carly Maxwell presents today preventative diabetic foot care and painful corn(s) b/l and callus(es) b/l and painful mycotic nails b/l.  Pain interferes with ambulation. Aggravating factors include wearing enclosed shoe gear.  She voices no new pedal concerns on today's visit.  Past Medical History:  Diagnosis Date  . Diabetes mellitus without complication [facility])      Current Outpatient Medications on File Prior to Visit  Medication Sig Dispense Refill  . calcium carbonate (OSCAL) 1500 (600 Ca) MG TABS tablet Take by mouth.    . Cholecalciferol 25 MCG (1000 UT) capsule Take by mouth.    . cyanocobalamin 1000 MCG tablet Take by mouth.    . conjugated estrogens (PREMARIN) vaginal cream Apply to affected area daily at night for two weeks, then twice a week for at least two months 42.5 g 5  . Continuous Blood Gluc Sensor (FREESTYLE LIBRE 14 DAY SENSOR) MISC USE 1 DEVICE Q 14 DAYS    . dorzolamide-timolol (COSOPT) 22.3-6.8 MG/ML ophthalmic solution Place 1 drop into both eyes 2 (two) times daily.    Marland Kitchen glipiZIDE (GLUCOTROL XL) 2.5 MG 24 hr tablet     . hydrochlorothiazide (MICROZIDE) 12.5 MG capsule Take 12.5 mg by mouth daily.    . insulin aspart protamine - aspart (NOVOLOG MIX 70/30 FLEXPEN) (70-30) 100 UNIT/ML FlexPen Inject 18 Units into the skin 2 (two) times daily with a meal.     . Insulin Degludec (TRESIBA FLEXTOUCH) 200 UNIT/ML SOPN Inject into the skin.    Marland Kitchen lisinopril (PRINIVIL,ZESTRIL) 5 MG tablet Take 5 mg by mouth daily.    Marland Kitchen losartan (COZAAR) 50 MG tablet TK 1 T PO QD    . losartan-hydrochlorothiazide (HYZAAR) 50-12.5 MG tablet     . meclizine (ANTIVERT) 25 MG tablet Take 1 tablet (25 mg total) by mouth 3 (three) times daily as needed for dizziness. 30 tablet 0  . meloxicam (MOBIC) 15 MG tablet Take 15 mg by mouth daily.    . metFORMIN (GLUCOPHAGE) 1000 MG tablet Take 1 tablet (1,000 mg total) by mouth 2 (two) times daily with a meal. 60 tablet 0  . ondansetron  (ZOFRAN ODT) 4 MG disintegrating tablet Take 1 tablet (4 mg total) by mouth every 8 (eight) hours as needed for nausea or vomiting. (Patient not taking: Reported on 10/18/2019) 20 tablet 0  . oxyCODONE-acetaminophen (PERCOCET/ROXICET) 5-325 MG tablet Take 1 tablet by mouth every 8 (eight) hours as needed for severe pain. (Patient not taking: Reported on 10/18/2019) 10 tablet 0  . OZEMPIC, 0.25 OR 0.5 MG/DOSE, 2 MG/1.5ML SOPN     . SYNJARDY XR 25-1000 MG TB24 Take 1 tablet by mouth at bedtime.    . travoprost, benzalkonium, (TRAVATAN) 0.004 % ophthalmic solution Place 1 drop into both eyes at bedtime.     No current facility-administered medications on file prior to visit.     Allergies  Allergen Reactions  . Latex Hives  . Lantus [Insulin Glargine] Hives and Cough    Objective: Carly Maxwell is a pleasant 59 y.o. y.o. Patient Race: Black or African American [2]  female in NAD. AAO x 3.  There were no vitals filed for this visit.  Vascular Examination: Capillary refill time to digits immediate b/l. Palpable DP pulses b/l. Palpable PT pulses b/l. Pedal hair present b/l. Skin temperature gradient within normal limits b/l.  Dermatological Examination: Pedal skin with normal turgor, texture and tone bilaterally. No open wounds bilaterally. No interdigital macerations bilaterally. Toenails 1-5 b/l elongated, discolored, dystrophic, thickened,  crumbly with subungual debris and tenderness to dorsal palpation. Hyperkeratotic lesion(s) lateral nailfold b/l 5th digits, L hallux and R hallux.  No erythema, no edema, no drainage, no flocculence.  Musculoskeletal: Normal muscle strength 5/5 to all lower extremity muscle groups bilaterally. No gross bony deformities bilaterally. No pain crepitus or joint limitation noted with ROM b/l.  Neurological Examination: Protective sensation intact 5/5 intact bilaterally with 10g monofilament b/l. Vibratory sensation intact b/l.  Assessment: 1. Pain due  to onychomycosis of toenails of both feet   2. Corns and callosities   3. Controlled type 2 diabetes mellitus without complication, without long-term current use of insulin (HCC)   Plan: -Examined patient. -No new findings. No new orders. -Diabetic foot examination performed on today's visit. -Toenails 1-5 b/l were debrided in length and girth with sterile nail nippers and dremel without iatrogenic bleeding.  -Corn(s) L 5th toe and R 5th toe and callus(es) L hallux and R hallux were pared utilizing sterile scalpel blade without incident. Total number debrided =4. -Patient to continue soft, supportive shoe gear daily. -Patient to report any pedal injuries to medical professional immediately. -Patient/POA to call should there be question/concern in the interim.  Return in about 3 months (around 08/14/2020) for diabetic nail trim.  Freddie Breech, DPM

## 2020-05-24 ENCOUNTER — Inpatient Hospital Stay
Payer: No Typology Code available for payment source | Attending: Internal Medicine | Admitting: Rehabilitative and Restorative Service Providers"

## 2020-05-24 VITALS — BP 129/81 | HR 57

## 2020-05-24 DIAGNOSIS — M542 Cervicalgia: Secondary | ICD-10-CM | POA: Insufficient documentation

## 2020-05-24 NOTE — Progress Notes (Signed)
Name:Hannah Horton Age: 59 y.o.   Date of Service: 05/24/2020  Referring Physician: Unice Cobble, MD   Date of Injury: 05/03/2020  Date Care Plan Established/Reviewed: 05/24/2020  Date Treatment Started: 05/24/2020  End of Certification Date: 08/21/2020  Sessions in Plan of Care: 16  Surgery Date: No data was found      Visit Count: 1   Diagnosis:   1. 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 They recommended surgery but she has not decided yet. It was about 3 weeks ago that she wanted to open a window at work and that aggravated her neck pain. She has had PT in the past that helped. She also had chiropractic treatment that also helped. Now pain is in the whole neck and UT without radiation into arms or thoracic spine. She denies tingling or numbness. She denies weakness in the LEs. No breathing issues. Using computer and reading increases her pain , moist heat helps. Patient has history of vertigo and R shoulder frozen shoulder which is fine now. Patient has headaches as well which is not new but worse after her neck got worse.   Functional Limitations (PLOF): Sleeping is disturbed, she needs to turn her body to look into her blind spot, using computer and reading increase her pain, lifting and carrying increases her pain, reaching for higher shelves increases her pain ( PLOF: sleeping was easier, lifting was fine, looking to her blind spot was easier, reaching was better, using computer and reading were all doable )     Outcome Measure   Tool Used/Details: FOTO  Score: 47%  Predicted Functional Outcome: 58%    Pain   Current pain rating: 4  At best pain rating: 1  At worst pain rating: 8    Social Support/Occupation  Lives in: multiple level home  Lives with: alone  Occupation: teacher       Precautions: hypertension  Allergies: Patient has no known  allergies.    Past Medical History:   Diagnosis Date   . Hypertension        Objective     Posture     Head  Forward.    Shoulders  Rounded.    Lumbar Spine   Increased lordosis.     Integumentary   no wound, lesion or rash noted    Range of Motion     AROM: Cervical/Thoracic Spine       Cervical Flexion 15 pain   Cervical Extension 30 pain   Thoracic Flexion     Thoracic Extension     (blank fields were intentionally left blank)         Left AROM Cervical/Thoracic    Right AROM   10 pain Cervical Lateral Flexion 15 pain   30 pain Cervical Rotation 25 pain     Thoracic Lateral Flexion       Thoracic Rotation     (blank fields were intentionally left blank)        Strength     IE    Left Strength  Shoulder  MMT IE    Right     Neck Lateral Flexion     4+   Shoulder Flexion 4+      Shoulder Extension     4+  Shoulder Abduction 4+      Shoulder IR       Shoulder ER       Serratus       Rhomboids     5  Upper Trapezius 5    3-  Middle Trapezius 3-      Lower Trapezius     5  Biceps 5    5  Triceps 5    (blank fields were intentionally left blank)    IE    Left Strength  Elbow  MMT IE    Right     Elbow Flexion        Elbow Extension       Forearm Supination        Forearm Pronation     4  Wrist Flexion 4    5  Wrist Extension 5      Radial Deviation       Ulnar Deviation     (blank fields were intentionally left blank)    Left Hand     Grip (2nd hand position)     Trial 1: 60    Trial 2: 60    Trial 3: 55    Average: 58.33    Right Hand     Grip (2nd hand position)     Trial 1: 40    Trial 2: 50    Trial 3: 50    Average: 46.67    Palpation Bilateral UT, mid trap, rhomboids, infraspinatus levator     Tests   Cervical     Left (Cervical)   Positive Spurling's sign and cervical distraction.   Negative alar ligament and transvere ligament.     Right (Cervical)   Positive Spurling's sign and cervical distraction.   Negative alar ligament and transverse ligament.     Neurological Testing     Sensation   Cervical/Thoracic    Left   Intact: light touch    Right   Intact: light touch    Reflexes   Left   Biceps (C5/C6): normal (2+)  Triceps (C7): normal (2+)    Right   Biceps (C5/C6): normal (2+)  Triceps (C7): normal (2+)    Shoulders : clear       BP: 129/81 Heart Rate: (!) 57    Treatment     Therapeutic Exercises - Justified to address any of the following: develop strength, endurance, ROM and/or flexibility.   See HEP  Exercises were performed under the supervision of the therapist    Home Exercises   Access Code: [email or web address]  URL: [email or web address]  Date: 05/24/2020  Prepared by: Derrek Gu    Exercises  .Standing Cervical Retraction - 2 x daily - 7 x weekly - 10 reps - 1 sets - 5 hold  .Supine Deep Neck Flexor Training - Repetitions - 1 x daily - 7 x weekly - 10 reps - 3 sets  .Seated Cervical Sidebending Stretch - 2 x daily - 7 x weekly - 5 reps - 1 sets - 30" hold  .Seated Levator Scapulae Stretch - 2 x daily - 7 x weekly - 5 reps - 1 sets - 30" hold  .Seated Scapular Retraction - 2 x daily - 7 x weekly - 20 reps - 1 sets - 5 hold  .Seated Assisted Cervical Rotation with Towel - 1 x daily - 7 x weekly - 10 reps - 3 sets           ---      ---  Total Time   Timed Minutes  10 minutes   Untimed Minutes  35 minutes   Total Time  45 minutes        Assessment   Hannah Horton is a 59 y.o. 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 who requires Physical Therapy for the following:    Decreased mobility and ROM of the cervical and thoracic spine, weakness of the scapular stabilizers, positive Spurling and distraction tests B, TrPs in the posterior cervicothoracic region     Pain located: neck      Clinical presentation: stable - local pain without causing other issues/symptoms  Barriers to therapy: Hypothyroidism, HTN,   Functional Limitations (PLOF): Sleeping is disturbed, she needs to turn her body to look into her blind spot, using computer and reading  increase her pain, lifting and carrying increases her pain, reaching for higher shelves increases her pain ( PLOF: sleeping was easier, lifting was fine, looking to her blind spot was easier, reaching was better, using computer and reading were all doable )   Prognosis: good  Plan   Visits per week: 2  Number of Sessions: 16  Direct One on One  96045: Therapeutic Exercise: To Develop Strength and Endurance, ROM and Flexibility  [id number]: Neuromuscular Reeducation  97140: Manual Therapy techniques (mobilization, manipulation, manual traction) (Soft tissue mobilization and Joint Mobs Grade 1-4 to cervical spine adn 1-5 to thoracic spine, STM as needed. )  97530: Therapeutic Activities: Dynamic activities to improve functional performance  40981: Ultrasound  Dry Needling  Supervised Modalities  97010: Thermal modalities: hot/cold packs  19147: Mechnical traction  97014: Electrical stimulation  97750: Physical performance test or measurement (eg, musculoskeletal, functional capacity).         Goals    Goal 1: Increasing range of cervical rotation to 75 deg for looking to her blind spot without turning her body   Sessions: 16      Goal 2: Increasing range of cervical flexion to 40 deg for reading without pain    Sessions: 16      Goal 3: Increasing range of cervical side bending to 40 deg for sleeping without being awakened by her neck pain    Sessions: 16      Goal 4: Increasing range of cervical extension to 50 deg for reaching for higher shelves   Sessions: 16          Goal 5: Increasing strength of mid trap to 5/5 for using computer and carrying/ lifting heavy objects for ADL without pain    Sessions: 16      Goal 6: Improving FOTO from 47% to 58% indicative of improved function    Sessions: 16      Goal 7: Patient to be able to do her HEP independently    Sessions: 16                   Derrek Gu, PT

## 2020-05-25 ENCOUNTER — Inpatient Hospital Stay: Payer: No Typology Code available for payment source | Attending: Internal Medicine

## 2020-05-25 DIAGNOSIS — M542 Cervicalgia: Secondary | ICD-10-CM

## 2020-05-25 NOTE — PT/OT Therapy Note (Cosign Needed)
Name: Jatavia Keltner Age: 59 y.o.   Date of Service: 05/25/2020  Referring Physician: Unice Cobble, MD   Date of Injury: 05/03/2020  Date Care Plan Established/Reviewed: 05/24/2020  Date Treatment Started: 05/24/2020  End of Certification Date: 08/21/2020  Sessions in Plan of Care: 16  Surgery Date: No data was found    Visit Count: 2   Diagnosis:   1. Cervicalgia        Subjective     Social Support/Occupation  Lives in: multiple level home  Lives with: alone  Occupation: Runner, broadcasting/film/video     Patent reported she is feeling ok today, but she has a lot of tightness in the R UT and levator scapula muscles She said she still has difficulty to sleep at night and wakes up in the middle night due to the discomfort around 6/10. She said exercises at home are helping her, but she is having difficulty performing the neck rotation with the towel. Patient asked if she should use heat or ice at home for her neck discomfort.      Precautions: hypertension  Allergies: Patient has no known allergies.      Objective   Initial Evaluation Reference and/or Current Measurements(as dated):         Range of Motion      AROM: Cervical/Thoracic Spine          Cervical Flexion 15 pain   Cervical Extension 30 pain   Thoracic Flexion       Thoracic Extension       (blank fields were intentionally left blank)             Left AROM Cervical/Thoracic      Right AROM   10 pain Cervical Lateral Flexion 15 pain   30 pain Cervical Rotation 25 pain       Thoracic Lateral Flexion           Thoracic Rotation       (blank fields were intentionally left blank)           Strength      IE     Left Strength  Shoulder  MMT IE     Right       Neck Lateral Flexion       4+    Shoulder Flexion 4+         Shoulder Extension       4+    Shoulder Abduction 4+         Shoulder IR           Shoulder ER           Serratus           Rhomboids       5   Upper Trapezius 5     3-   Middle Trapezius 3-         Lower Trapezius       5   Biceps 5     5   Triceps 5     (blank fields  were intentionally left blank)     IE     Left Strength  Elbow  MMT IE     Right       Elbow Flexion            Elbow Extension           Forearm Supination            Forearm Pronation  4   Wrist Flexion 4     5   Wrist Extension 5         Radial Deviation           Ulnar Deviation       (blank fields were intentionally left blank)                Treatment     Therapeutic Exercises - Justified to address any of the following: develop strength, endurance, ROM and/or flexibility.     UBE 5 minutes to warm up and increase enurance, advised patient regarding the plan and treatments for today's session     Cervical retraction with RTB 10x 5" hold, demonstrated for patient the correct form    Shoulder scapular retraction with RTB 3x10    Shoulder flexion 2# weight 3x10     Updated HEP and took off the cervical rotation with towel exercises and instead added cervical rotation with pressure on chin for her, she demonstrated here for correct posture observation and printed out the HEP for her    Manual Therapy - Justified to address any of the following:  Mobilization of joints and soft tissues, manipulation, manual lymphatic drainage, and/or manual traction.    Manual STM and deep tissue was done on R UT and levator scapula as well as R rhomboids to decrease tensions, decrease stiffness and severe tightness.     Performed thera gun on the same muscles above to bring blood circulation to areas for faster results to comfort patient prior to start exercises      Therapeutic Activity - Justified to address the following: activities to improve functional performance.  Educated patient to use heat for comfort at home when feeling too much discomfort or before performing the neck stretches to assist with comfort during HEP and better blood circulation for stretching.        Home Exercises   Home Exercises   Access Code: [email or web address]  URL: [email or web address]  Date: 05/24/2020  Prepared by: Derrek Gu    Exercises  .Standing Cervical Retraction - 2 x daily - 7 x weekly - 10 reps - 1 sets - 5 hold  .Supine Deep Neck Flexor Training - Repetitions - 1 x daily - 7 x weekly - 10 reps - 3 sets  .Seated Cervical Sidebending Stretch - 2 x daily - 7 x weekly - 5 reps - 1 sets - 30" hold  .Seated Levator Scapulae Stretch - 2 x daily - 7 x weekly - 5 reps - 1 sets - 30" hold  .Seated Scapular Retraction - 2 x daily - 7 x weekly - 20 reps - 1 sets - 5 hold  .Seated Assisted Cervical Rotation with Towel - 1 x daily - 7 x weekly - 10 reps - 3 sets         ---      ---   Total Time   Timed Minutes  45 minutes   Total Time  45 minutes        Assessment   Patient was advised to maybe try DN if interested if after a few sessions she can still feel tightness in her deeper muscles in R side of the neck. Patient had well tolerance towards the new exercises for neck and shoulders, but is demonstrating with moderate weakness in B sides as well as severe tightness in B neck muscles such as UT and levators and will benefit from skilled PT to decrease the tightness and to  strengthen the UE to overall to decrease the pain and discomfort in her neck.  Plan   Continue with POC and to slowly progress patient towards updated strengthening exercises and assess tightness in R UT and levator next session.      Goals    Goal 1: Increasing range of cervical rotation to 75 deg for looking to her blind spot without turning her body   Sessions: 16      Goal 2: Increasing range of cervical flexion to 40 deg for reading without pain    Sessions: 16      Goal 3: Increasing range of cervical side bending to 40 deg for sleeping without being awakened by her neck pain    Sessions: 16      Goal 4: Increasing range of cervical extension to 50 deg for reaching for higher shelves   Sessions: 16          Goal 5: Increasing strength of mid trap to 5/5 for using computer and carrying/ lifting heavy objects for ADL without pain    Sessions: 16       Goal 6: Improving FOTO from 47% to 58% indicative of improved function    Sessions: 16      Goal 7: Patient to be able to do her HEP independently     05/25/20 MF: HEP was updated with an exercise and modified. Patient requires min cues for postural correction. In progress    Sessions: 16                   Paisley Grajeda, LPTA

## 2020-05-26 ENCOUNTER — Inpatient Hospital Stay: Payer: Self-pay

## 2020-05-26 DIAGNOSIS — Z0000 Encounter for general adult medical examination without abnormal findings: Secondary | ICD-10-CM

## 2020-05-26 NOTE — PT/OT Therapy Note (Signed)
Name: Hannah Horton Age: 59 y.o.   Date of Service: 05/26/2020  Referring Physician:     Date of Injury: No data was found  Date Care Plan Established/Reviewed: No data was found  Date Treatment Started: No data was found  End of Certification Date: No data was found  Sessions in Plan of Care: No data was found  Surgery Date: No data was found    Visit Count: Visit count could not be calculated. Make sure you are using a visit which is associated with an episode.   Diagnosis:   1. Wellness examination             Precautions: No data was found  Allergies: Patient has no known allergies.                               Tinnie Gens Abhimanyu Cruces, CMT

## 2020-05-26 NOTE — PT/OT Therapy Note (Signed)
Patient has tightness in upper back and shoulders.    Triggers in upper trapezius, scalenes, subscapularis (r)      Released triggers, activated middle trapezius, latissimus dorsi (l/r)      Follow up in 2 weeks.

## 2020-05-29 ENCOUNTER — Inpatient Hospital Stay: Payer: No Typology Code available for payment source | Attending: Internal Medicine

## 2020-05-29 DIAGNOSIS — M542 Cervicalgia: Secondary | ICD-10-CM | POA: Insufficient documentation

## 2020-05-29 NOTE — PT/OT Therapy Note (Cosign Needed)
Name: Tammela Bales Age: 59 y.o.   Date of Service: 05/29/2020  Referring Physician: Unice Cobble, MD   Date of Injury: 05/03/2020  Date Care Plan Established/Reviewed: 05/24/2020  Date Treatment Started: 05/24/2020  End of Certification Date: 08/21/2020  Sessions in Plan of Care: 16  Surgery Date: No data was found    Visit Count: 3   Diagnosis:   1. Cervicalgia        Subjective     Social Support/Occupation  Lives in: multiple level home  Lives with: alone  Occupation: Runner, broadcasting/film/video     Patent reported she has a lot of discomfort on her R UT and levator scapula muscles with tightness and stiffness. She reported that she came in for a massage here on Saturday and the therapist worked on her R neck muscles and also back which noticed a lot of trigger points and tightness there as well that she was not aware of. She stated she feels much better after manual therapy in today's session.      Precautions: hypertension  Allergies: Patient has no known allergies.      Objective   Initial Evaluation Reference and/or Current Measurements(as dated):         Range of Motion      AROM: Cervical/Thoracic Spine          Cervical Flexion 15 pain   Cervical Extension 30 pain   Thoracic Flexion       Thoracic Extension       (blank fields were intentionally left blank)             Left AROM Cervical/Thoracic      Right AROM   10 pain Cervical Lateral Flexion 15 pain   30 pain Cervical Rotation 25 pain       Thoracic Lateral Flexion           Thoracic Rotation       (blank fields were intentionally left blank)           Strength      IE     Left Strength  Shoulder  MMT IE     Right       Neck Lateral Flexion       4+    Shoulder Flexion 4+         Shoulder Extension       4+    Shoulder Abduction 4+         Shoulder IR           Shoulder ER           Serratus           Rhomboids       5   Upper Trapezius 5     3-   Middle Trapezius 3-         Lower Trapezius       5   Biceps 5     5   Triceps 5     (blank fields were intentionally left  blank)     IE     Left Strength  Elbow  MMT IE     Right       Elbow Flexion            Elbow Extension           Forearm Supination            Forearm Pronation       4   Wrist  Flexion 4     5   Wrist Extension 5         Radial Deviation           Ulnar Deviation       (blank fields were intentionally left blank)                      Treatment     Therapeutic Exercises - Justified to address any of the following: develop strength, endurance, ROM and/or flexibility.     UBE 5 minutes to warm up and increase enurance, advised patient regarding the plan and treatments for today's session     Shoulder flexion with RTB 3x10, cued for thumbs up    Shoulder abductions 3x10 with 1# weight       Reviewed HEP with patient again and went over the plans for strengthening exercises as patient continues to next week to slowly focus on strengthening as well     Neuromuscular Re-Education - Justified to address any of the following: of movement, balance, coordination, kinesthetic sense, posture and/or proprioception for sitting and/or standing activities.     Shoulder scapular retraction with RTB 2x15, cued for core engagement and to squeeze the muscles in between shoulder blades    Cervical retraction with a ball behind the head 15x 5" hold, demonstrated for patient the correct form      Manual Therapy - Justified to address any of the following:  Mobilization of joints and soft tissues, manipulation, manual lymphatic drainage, and/or manual traction.    Manual STM and deep tissue was done on R UT and levator scapula as well as R rhomboids to decrease tensions, decrease stiffness and severe tightness.     Performed thera gun on the same muscles above to bring blood circulation to areas for faster results to comfort and decreasing tension and stiffness  prior to start exercises      Therapeutic Activity - Justified to address the following: activities to improve functional performance.          Home Exercises   Home Exercises    Access Code: [email or web address]  URL: [email or web address]  Date: 05/24/2020  Prepared by: Derrek Gu    Exercises  .Standing Cervical Retraction - 2 x daily - 7 x weekly - 10 reps - 1 sets - 5 hold  .Supine Deep Neck Flexor Training - Repetitions - 1 x daily - 7 x weekly - 10 reps - 3 sets  .Seated Cervical Sidebending Stretch - 2 x daily - 7 x weekly - 5 reps - 1 sets - 30" hold  .Seated Levator Scapulae Stretch - 2 x daily - 7 x weekly - 5 reps - 1 sets - 30" hold  .Seated Scapular Retraction - 2 x daily - 7 x weekly - 20 reps - 1 sets - 5 hold  .Seated Assisted Cervical Rotation with Towel - 1 x daily - 7 x weekly - 10 reps - 3 sets         ---      ---   Total Time   Timed Minutes  40 minutes   Total Time  40 minutes        Assessment   Today's session focused on gentle strengthening as well as soft tissue work on R neck muscles to decrease tigthnes and stiffness. 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   Plan   Continue with POC and to slowly progress patient towards updated strengthening exercises. Focus on more manual therapy due to severe discomfort and tightness next session. Assess MMT.      Goals    Goal 1: Increasing range of cervical rotation to 75 deg for looking to her blind spot without turning her body   Sessions: 16      Goal 2: Increasing range of cervical flexion to 40 deg for reading without pain    Sessions: 16      Goal 3: Increasing range of cervical side bending to 40 deg for sleeping without being awakened by her neck pain    Sessions: 16      Goal 4: Increasing range of cervical extension to 50 deg for reaching for higher shelves   Sessions: 16           Goal 5: Increasing strength of mid trap to 5/5 for using computer and carrying/ lifting heavy objects for ADL without pain    Sessions: 16      Goal 6: Improving FOTO from 47% to 58% indicative of improved function    Sessions: 16      Goal 7: Patient to be able to do her HEP independently     05/25/20 MF: HEP was updated with an exercise and modified. Patient requires min cues for postural correction. In progress    Sessions: 16                   Jayceion Lisenby, LPTA

## 2020-06-02 ENCOUNTER — Inpatient Hospital Stay: Payer: No Typology Code available for payment source | Attending: Internal Medicine

## 2020-06-02 DIAGNOSIS — Z0000 Encounter for general adult medical examination without abnormal findings: Secondary | ICD-10-CM

## 2020-06-02 NOTE — PT/OT Therapy Note (Signed)
Name: Angellina Ferdinand Age: 59 y.o.   Date of Service: 06/02/2020  Referring Physician: Unice Cobble, MD   Date of Injury: No data was found  Date Care Plan Established/Reviewed: No data was found  Date Treatment Started: No data was found  End of Certification Date: No data was found  Sessions in Plan of Care: No data was found  Surgery Date: No data was found    Visit Count: Visit count could not be calculated. Make sure you are using a visit which is associated with an episode.   Diagnosis:   1. Wellness examination             Precautions: No data was found  Allergies: Patient has no known allergies.                               Tinnie Gens Harlowe Dowler, CMT

## 2020-06-02 NOTE — PT/OT Therapy Note (Signed)
Patient has tightness in shoulders and neck.    Triggers in upper trapezius, scalenes, rhomboids, SCM  and scalenes.      Released triggers, activated latissimus dorsi (r/l)    Patient feels relief. Follow up in 1-2 weeks.

## 2020-06-04 ENCOUNTER — Inpatient Hospital Stay
Payer: No Typology Code available for payment source | Attending: Internal Medicine | Admitting: Rehabilitative and Restorative Service Providers"

## 2020-06-04 DIAGNOSIS — M542 Cervicalgia: Secondary | ICD-10-CM | POA: Insufficient documentation

## 2020-06-04 NOTE — PT/OT Therapy Note (Signed)
Name: Hannah Horton Age: 59 y.o.   Date of Service: 06/04/2020  Referring Physician: Unice Cobble, MD   Date of Injury: 05/03/2020  Date Care Plan Established/Reviewed: 05/24/2020  Date Treatment Started: 05/24/2020  End of Certification Date: 08/21/2020  Sessions in Plan of Care: 16  Surgery Date: No data was found    Visit Count: 4   Diagnosis:   1. 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 Weekly massages have been helping. She feels cramping/tightening sensation with computer use.       Precautions: hypertension  Allergies: Patient has no known allergies.      Objective   Initial Evaluation Reference and/or Current Measurements(as dated):         Range of Motion      AROM: Cervical/Thoracic Spine     IE  06/04/20   Cervical Flexion 15 pain 25   Cervical Extension 30 pain 33   Thoracic Flexion       Thoracic Extension       (blank fields were intentionally left blank)      IE Left AROM     06/04/20  Left AROM Cervical/Thoracic  IE Right AROM  06/04/20  Right AROM   10 pain 30 Cervical Lateral Flexion 15 pain 25   30 pain 36 Cervical Rotation 25 pain 35       Thoracic Lateral Flexion           Thoracic Rotation       (blank fields were intentionally left blank)           Strength      IE     Left Strength  Shoulder  MMT IE     Right       Neck Lateral Flexion       4+    Shoulder Flexion 4+         Shoulder Extension       4+    Shoulder Abduction 4+         Shoulder IR           Shoulder ER           Serratus           Rhomboids       5   Upper Trapezius 5     3-   Middle Trapezius 3-         Lower Trapezius       5   Biceps 5     5   Triceps 5     (blank fields were intentionally left blank)     IE  Left    Left Strength  Elbow  MMT/5 IE Right    Right       Elbow Flexion            Elbow Extension           Forearm Supination            Forearm Pronation       4   Wrist  Flexion 4     5   Wrist Extension 5         Radial Deviation           Ulnar Deviation       (blank fields  were intentionally left blank)                      Treatment     Therapeutic Exercises - Justified to address any of the following: develop strength, endurance, ROM and/or flexibility.     UBE 5 minutes to warm up and increase enurance, advised patient regarding the plan and treatments for today's session     sidelying open books 10" x 10 B    Prone Ws 2 x 10 reps tactile cuing on scap retraction    Prone Ts 2 x 10 reps verbal and tactile cuing to avoid shoulder shrug    Wall Push ups + protraction 2 x 10 verbal cuing on cervical retraction and neutral spine    Not today:    Shoulder flexion with RTB 3x10, cued for thumbs up    Shoulder abductions 3x10 with 1# weight         Neuromuscular Re-Education - Justified to address any of the following: of movement, balance, coordination, kinesthetic sense, posture and/or proprioception for sitting and/or standing activities.   Right scapula anterior elevation and posterior depression with progression of isotonics in sidelying    Prone on elbows with alternating trunk rotation for stabilization    Prone on elbows with resisted bilateral forearm supination and wrist extension to facilitate rhomboid activation    Not today:  Shoulder scapular retraction with RTB 2x15, cued for core engagement and to squeeze the muscles in between shoulder blades    Cervical retraction with a ball behind the head 15x 5" hold, demonstrated for patient the correct form      Manual Therapy - Justified to address any of the following:  Mobilization of joints and soft tissues, manipulation, manual lymphatic drainage, and/or manual traction.    Extensive STM to right upper trap and levator scapula in sidelying    Not today:  Manual STM and deep tissue was done on R UT and levator scapula as well as R rhomboids to decrease tensions, decrease stiffness and severe tightness.     Performed thera  gun on the same muscles above to bring blood circulation to areas for faster results to comfort and decreasing tension and stiffness  prior to start exercises      Therapeutic Activity - Justified to address the following: activities to improve functional performance.      Discussed with patient on sleeping in sidelying with pillow in between knees and towel roll along trunk to limit side gapping of spine to allow for neutral alignment, use 1-2 layer pillow support at neck for neutral position. Educated patient and provided visual demonstration of to avoid excessive lateral flexion of neck    Home Exercises   Home Exercises   Access Code: [email or web address]  URL: [email or web address]  Date: 05/24/2020  Prepared by: Derrek Gu    Exercises  .Standing Cervical Retraction - 2 x daily - 7 x weekly - 10 reps - 1 sets - 5 hold  .Supine Deep Neck Flexor Training - Repetitions - 1 x daily - 7 x weekly - 10 reps - 3 sets  .Seated Cervical Sidebending Stretch - 2 x daily - 7 x weekly - 5 reps - 1 sets - 30" hold  .Seated Levator Scapulae Stretch - 2 x daily - 7 x weekly - 5 reps - 1 sets - 30" hold  .Seated Scapular Retraction - 2 x daily - 7 x weekly - 20 reps - 1 sets - 5 hold  .  Seated Assisted Cervical Rotation with Towel - 1 x daily - 7 x weekly - 10 reps - 3 sets         ---      ---   Total Time   Timed Minutes  51 minutes   Total Time  51 minutes        Assessment   Hannah Horton demonstrated overall good tolerance to session today. Addressed increased tone most noted in right upper trap and levator scapulae. Focused session on scapula stabilizers activation due to excessive anterior tipping of scapulae. She was challenged engaging rhomboids in prone on elbows position. Implemented wall push ups with tactile cuing on scapula mobility. She required cuing to maintain neutral spine and avoiding forward head posture. She demonstrated improved performance with cuing. Updated cervical range of motion shows improvement  in all planes. Pt will benefit from continued skilled PT to restore cervical range of motion, myofascia restrictions, scapula stabilizer strength, and posture for prolonged computer use, driving, and sleeping.    Plan   Continue to focus on scapular stabilizer strengthening/posture      Goals    Goal 1: Increasing range of cervical rotation to 75 deg for looking to her blind spot without turning her body    06/04/20 AA: left- 36 deg, right- 35 deg, with improved pain tolerance.   Sessions: 16      Goal 2: Increasing range of cervical flexion to 40 deg for reading without pain     06/04/20 AA: 25 deg flexion with improved pain with task   Sessions: 16      Goal 3: Increasing range of cervical side bending to 40 deg for sleeping without being awakened by her neck pain     06/04/20 AA: left- 30 deg, right- 25 deg. Patient unable to get comfortable in bed. Sleeping in recliner.   Sessions: 16      Goal 4: Increasing range of cervical extension to 50 deg for reaching for higher shelves    06/04/20 AA: 33 deg extension, limited due to pain   Sessions: 16          Goal 5: Increasing strength of mid trap to 5/5 for using computer and carrying/ lifting heavy objects for ADL without pain    Sessions: 16      Goal 6: Improving FOTO from 47% to 58% indicative of improved function    Sessions: 16      Goal 7: Patient to be able to do her HEP independently     05/25/20 MF: HEP was updated with an exercise and modified. Patient requires min cues for postural correction. In progress    Sessions: 16                   Hannah Horton, DPT

## 2020-06-08 ENCOUNTER — Inpatient Hospital Stay: Payer: No Typology Code available for payment source | Attending: Internal Medicine

## 2020-06-08 DIAGNOSIS — M542 Cervicalgia: Secondary | ICD-10-CM | POA: Insufficient documentation

## 2020-06-08 NOTE — PT/OT Therapy Note (Cosign Needed)
Name: Hannah Horton Age: 59 y.o.   Date of Service: 06/08/2020  Referring Physician: Unice Cobble, MD   Date of Injury: 05/03/2020  Date Care Plan Established/Reviewed: 05/24/2020  Date Treatment Started: 05/24/2020  End of Certification Date: 08/21/2020  Sessions in Plan of Care: 16  Surgery Date: No data was found    Visit Count: 5   Diagnosis:   1. 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      Precautions: hypertension  Allergies: Patient has no known allergies.      Objective   Initial Evaluation Reference and/or Current Measurements(as dated):         Range of Motion      AROM: Cervical/Thoracic Spine     IE  06/04/20   Cervical Flexion 15 pain 25   Cervical Extension 30 pain 33   Thoracic Flexion       Thoracic Extension       (blank fields were intentionally left blank)      IE Left AROM     06/04/20  Left AROM Cervical/Thoracic  IE Right AROM  06/04/20  Right AROM   10 pain 30 Cervical Lateral Flexion 15 pain 25   30 pain 36 Cervical Rotation 25 pain 35       Thoracic Lateral Flexion           Thoracic Rotation       (blank fields were intentionally left blank)           Strength      IE     Left Strength  Shoulder  MMT IE     Right       Neck Lateral Flexion       4+    Shoulder Flexion 4+         Shoulder Extension       4+    Shoulder Abduction 4+         Shoulder IR           Shoulder ER           Serratus           Rhomboids       5   Upper Trapezius 5     3-   Middle Trapezius 3-         Lower Trapezius       5   Biceps 5     5   Triceps 5     (blank fields were intentionally left blank)     IE  Left    Left Strength  Elbow  MMT/5 IE Right    Right       Elbow Flexion            Elbow Extension           Forearm  Supination            Forearm Pronation       4   Wrist Flexion 4     5   Wrist Extension 5         Radial Deviation  Ulnar Deviation       (blank fields were intentionally left blank)                      Treatment     Therapeutic Exercises - Justified to address any of the following: develop strength, endurance, ROM and/or flexibility.     UBE 5 minutes to warm up and increase enurance, advised patient regarding the plan and treatments for today's session    FOTO was done and patient is slowly showing progress     Supine shoulder flexion 1# 2x10    B shoulder abductions 1# 3x10, cued for thumbs forward and 90 degrees motion     Shoulder rows with RTB 3x10, cued for scapula squeeze and shoulder retraction     scapular retraction 10x 5" hold    Prone mid trap with 1# 2x10 on plinth       Not today 06/08/20   UBE 5 minutes to warm up and increase enurance, advised patient regarding the plan and treatments for today's session     sidelying open books 10" x 10 B    Prone Ws 2 x 10 reps tactile cuing on scap retraction    Prone Ts 2 x 10 reps verbal and tactile cuing to avoid shoulder shrug    Wall Push ups + protraction 2 x 10 verbal cuing on cervical retraction and neutral spine          Manual Therapy - Justified to address any of the following:  Mobilization of joints and soft tissues, manipulation, manual lymphatic drainage, and/or manual traction.      Manual STM and deep tissue was done on B UT and levator scapula as well as B rhomboids to decrease tensions, decrease stiffness and mild tightness.         Therapeutic Activity - Justified to address the following: activities to improve functional performance.          Home Exercises   Home Exercises   Access Code: [email or web address]  URL: [email or web address]  Date: 05/24/2020  Prepared by: Derrek Gu    Exercises  .Standing Cervical Retraction - 2 x daily - 7 x weekly - 10 reps - 1 sets - 5 hold  .Supine Deep Neck Flexor Training - Repetitions -  1 x daily - 7 x weekly - 10 reps - 3 sets  .Seated Cervical Sidebending Stretch - 2 x daily - 7 x weekly - 5 reps - 1 sets - 30" hold  .Seated Levator Scapulae Stretch - 2 x daily - 7 x weekly - 5 reps - 1 sets - 30" hold  .Seated Scapular Retraction - 2 x daily - 7 x weekly - 20 reps - 1 sets - 5 hold  .Seated Assisted Cervical Rotation with Towel - 1 x daily - 7 x weekly - 10 reps - 3 sets         ---      ---   Total Time   Timed Minutes  45 minutes   Total Time  45 minutes        Assessment   FOTO was done and patient is showing progress towards therapy goals and decreasing pain. Patient demonstrated with mild tightness and trigger points to palpation and reported she feels less trigger points today. Therapy session today focused gentle strengthening to avoid any further tightness or discomfort and patient tolerated the gentle strengthening exercises well and with no symptoms. Patient reported she feels much better after  treatment and her B cervical muscles felt more loose and relaxed. Patient will continue to benefit from skilled PT to strengthen B UE strengthening to decrease using cervical muscles for compensation due to weakness.  Plan   Continue to focus on scapular stabilizer strengthening/posture and assess symptoms throughout the session.      Goals    Goal 1: Increasing range of cervical rotation to 75 deg for looking to her blind spot without turning her body    06/04/20 AA: left- 36 deg, right- 35 deg, with improved pain tolerance.   Sessions: 16      Goal 2: Increasing range of cervical flexion to 40 deg for reading without pain     06/04/20 AA: 25 deg flexion with improved pain with task   Sessions: 16      Goal 3: Increasing range of cervical side bending to 40 deg for sleeping without being awakened by her neck pain     06/04/20 AA: left- 30 deg, right- 25 deg. Patient unable to get comfortable in bed. Sleeping in recliner.   Sessions: 16      Goal 4: Increasing range of cervical extension to 50 deg  for reaching for higher shelves    06/04/20 AA: 33 deg extension, limited due to pain   Sessions: 16          Goal 5: Increasing strength of mid trap to 5/5 for using computer and carrying/ lifting heavy objects for ADL without pain    Sessions: 16      Goal 6: Improving FOTO from 47% to 58% indicative of improved function     06/08/20 MF: FOTO was done with score of 50/58. Patient is showing progress towards goals.   Sessions: 16      Goal 7: Patient to be able to do her HEP independently     05/25/20 MF: HEP was updated with an exercise and modified. Patient requires min cues for postural correction. In progress    Sessions: 16                   Jasaiah Karwowski, LPTA

## 2020-06-12 ENCOUNTER — Inpatient Hospital Stay
Payer: No Typology Code available for payment source | Attending: Internal Medicine | Admitting: Rehabilitative and Restorative Service Providers"

## 2020-06-12 DIAGNOSIS — M542 Cervicalgia: Secondary | ICD-10-CM | POA: Insufficient documentation

## 2020-06-12 NOTE — PT/OT Therapy Note (Signed)
Name: Hannah Horton Age: 59 y.o.   Date of Service: 06/12/2020  Referring Physician: Unice Cobble, MD   Date of Injury: 05/03/2020  Date Care Plan Established/Reviewed: 05/24/2020  Date Treatment Started: 05/24/2020  End of Certification Date: 08/21/2020  Sessions in Plan of Care: 16  Surgery Date: No data was found    Visit Count: 6   Diagnosis:   1. Cervicalgia        Subjective     Outcome Measure   Tool Used/Details: FOTO 06/08/20  Score: 50  Predicted Functional Outcome: 8    Social Support/Occupation  Lives in: multiple level home  Lives with: alone  Occupation: teacher     Patient reports that she is feeling good today compared to last session. Challenges remain with turning her head  To look in blind spot.      Precautions: hypertension  Allergies: Patient has no known allergies.      Objective   Initial Evaluation Reference and/or Current Measurements(as dated):         Range of Motion      AROM: Cervical/Thoracic Spine     IE  06/04/20   Cervical Flexion 15 pain 25   Cervical Extension 30 pain 33   Thoracic Flexion       Thoracic Extension       (blank fields were intentionally left blank)      IE Left AROM     06/04/20  Left AROM Cervical/Thoracic  IE Right AROM  06/04/20  Right AROM   10 pain 30 Cervical Lateral Flexion 15 pain 25   30 pain 36 Cervical Rotation 25 pain 35       Thoracic Lateral Flexion           Thoracic Rotation       (blank fields were intentionally left blank)           Strength      IE     Left Strength  Shoulder  MMT IE     Right       Neck Lateral Flexion       4+    Shoulder Flexion 4+         Shoulder Extension       4+    Shoulder Abduction 4+         Shoulder IR           Shoulder ER           Serratus           Rhomboids       5   Upper Trapezius 5     3-   Middle Trapezius 3-         Lower Trapezius       5   Biceps 5     5   Triceps 5     (blank fields were intentionally left blank)     IE  Left    Left Strength  Elbow  MMT/5 IE Right    Right       Elbow Flexion             Elbow Extension           Forearm Supination            Forearm Pronation       4   Wrist Flexion 4     5   Wrist Extension 5  Radial Deviation           Ulnar Deviation       (blank fields were intentionally left blank)                      Treatment     Therapeutic Exercises - Justified to address any of the following: develop strength, endurance, ROM and/or flexibility.     UBE 5 minutes level 1.5 to warm up and increase enurance, advised patient regarding the plan and treatments for today's session    sidelying open books 5" x 10 B    (Progressed to GTB )Shoulder rows with GTB 2 x 10, cued for scapula squeeze and shoulder retraction     Not today: 06/12/20      Wall Push ups + protraction 2 x 10 verbal cuing on cervical retraction and neutral spine    scapular retraction 10x 5" hold    Prone mid trap with 1# 2x10 on plinth     Prone Ws 2 x 10 reps tactile cuing on scap retraction    Prone Ts 2 x 10 reps verbal and tactile cuing to avoid shoulder shrug    Supine shoulder flexion 1# 2x10    B shoulder abductions 1# 3x10, cued for thumbs forward and 90 degrees motion       Neuromuscular Re-Education - Justified to address any of the following: of movement, balance, coordination, kinesthetic sense, posture and/or proprioception for sitting and/or standing activities.   Prone thoracic extension on elbows 5" holds x 10    Seated B shoulder ER against RTB 5" holds 2 x 10 verbal cuing on scap retraction    Standing B shoulder scaption 1# DBs 2 x 10 verbal cuing on to avoid shoulder shrug    Standing B shoulder horizontal abduction against RTB 2 x 10    Manual Therapy - Justified to address any of the following:  Mobilization of joints and soft tissues, manipulation, manual lymphatic drainage, and/or manual traction.    STM to cervical parpspinals, left upper trap, levator scapulae in supine    Manual cervical distraction  In supine    Therapeutic Activity - Justified to address the following: activities to improve  functional performance.          Home Exercises   Access Code: [email or web address]  URL: [email or web address]  Date: 06/12/2020  Prepared by: Jilda Panda    Exercises  Standing Cervical Retraction - 2 x daily - 7 x weekly - 10 reps - 1 sets - 5 hold  Seated Cervical Sidebending Stretch - 2 x daily - 7 x weekly - 5 reps - 1 sets - 30" hold  Supine Deep Neck Flexor Training - Repetitions - 1 x daily - 7 x weekly - 10 reps - 3 sets  Sidelying Thoracic Rotation with Open Book - 1 x daily - 7 x weekly - 2 sets - 10 reps - 10 hold  Seated Levator Scapulae Stretch - 2 x daily - 7 x weekly - 5 reps - 1 sets - 30" hold  Standing Cervical Rotation AROM with Overpressure - 1 x daily - 7 x weekly - 2 sets - 10 reps - 5" hold  Seated Shoulder External Rotation with Dumbbells - 1 x daily - 7 x weekly - 2 sets - 10 reps - 10 hold  Scaption with Dumbbells - 1 x daily - 7 x weekly - 3 sets - 10 reps  Standing Shoulder Horizontal Abduction with Resistance -  1 x daily - 7 x weekly - 3 sets - 10 reps         ---      ---   Total Time   Timed Minutes  41 minutes   Total Time  41 minutes        Assessment   Hannah Horton completed session within her tolerance. Noted difficulty with scap retraction due to weakness and fatigue. Implemented NMR to focus on low and mid trap postural mm to inhibit upper trap substitution. Patient will continue to benefit from skilled PT to strengthen B UE strengthening to decrease using cervical muscles for compensation due to weakness.  Plan   Continue to focus on scapular stabilizer strengthening/posture and assess symptoms throughout the session.      Goals    Goal 1: Increasing range of cervical rotation to 75 deg for looking to her blind spot without turning her body    06/04/20 AA: left- 36 deg, right- 35 deg, with improved pain tolerance.   Sessions: 16      Goal 2: Increasing range of cervical flexion to 40 deg for reading without pain     06/04/20 AA: 25 deg flexion with improved pain with task    Sessions: 16      Goal 3: Increasing range of cervical side bending to 40 deg for sleeping without being awakened by her neck pain     06/04/20 AA: left- 30 deg, right- 25 deg. Patient unable to get comfortable in bed. Sleeping in recliner.   Sessions: 16      Goal 4: Increasing range of cervical extension to 50 deg for reaching for higher shelves    06/04/20 AA: 33 deg extension, limited due to pain   Sessions: 16          Goal 5: Increasing strength of mid trap to 5/5 for using computer and carrying/ lifting heavy objects for ADL without pain    Sessions: 16      Goal 6: Improving FOTO from 47% to 58% indicative of improved function     06/08/20 MF: FOTO was done with score of 50/58. Patient is showing progress towards goals.   Sessions: 16      Goal 7: Patient to be able to do her HEP independently     05/25/20 MF: HEP was updated with an exercise and modified. Patient requires min cues for postural correction. In progress    Sessions: 16                   Cruzita Lederer, DPT

## 2020-06-14 ENCOUNTER — Inpatient Hospital Stay: Payer: No Typology Code available for payment source

## 2020-06-19 ENCOUNTER — Inpatient Hospital Stay
Payer: No Typology Code available for payment source | Attending: Internal Medicine | Admitting: Rehabilitative and Restorative Service Providers"

## 2020-06-19 DIAGNOSIS — M542 Cervicalgia: Secondary | ICD-10-CM | POA: Insufficient documentation

## 2020-06-19 NOTE — PT/OT Therapy Note (Signed)
Name: Hannah Horton Age: 59 y.o.   Date of Service: 06/19/2020  Referring Physician: Unice Cobble, MD   Date of Injury: 05/03/2020  Date Care Plan Established/Reviewed: 05/24/2020  Date Treatment Started: 05/24/2020  End of Certification Date: 08/21/2020  Sessions in Plan of Care: 16  Surgery Date: No data was found    Visit Count: 7   Diagnosis:   1. Cervicalgia        Subjective     Outcome Measure   Tool Used/Details: FOTO 06/08/20  Score: 50  Predicted Functional Outcome: 23    Social Support/Occupation  Lives in: multiple level home  Lives with: alone  Occupation: teacher     Patient reports that she feels her neck is getting better in regards to the range. She purchased and used a Scientist, clinical (histocompatibility and immunogenetics) that straps onto her which has helped the pain. There is still pain with turning her head with driving and sleeping. She states that she goes back and forth from her bed to recliner.      Precautions: hypertension  Allergies: Patient has no known allergies.      Objective   Initial Evaluation Reference and/or Current Measurements(as dated):         Range of Motion      AROM: Cervical/Thoracic Spine     IE  06/04/20 06/19/20   Cervical Flexion 15 pain 25 53   Cervical Extension 30 pain 33 45   Thoracic Flexion        Thoracic Extension        (blank fields were intentionally left blank)      IE Left AROM     06/04/20  Left AROM 06/19/20 Left AROM Cervical/Thoracic  IE Right AROM  06/04/20  Right AROM 06/19/20 Right AROM   10 pain 30 20 Cervical Lateral Flexion 15 pain 25 20   30  pain 36 60 Cervical Rotation 25 pain 35 60        Thoracic Lateral Flexion             Thoracic Rotation        (blank fields were intentionally left blank)           Strength      IE     Left Strength  Shoulder  MMT IE     Right       Neck Lateral Flexion       4+    Shoulder Flexion 4+         Shoulder Extension       4+    Shoulder Abduction 4+         Shoulder IR           Shoulder ER           Serratus           Rhomboids       5   Upper Trapezius 5      3-   Middle Trapezius 3-         Lower Trapezius       5   Biceps 5     5   Triceps 5     (blank fields were intentionally left blank)     IE  Left    Left Strength  Elbow  MMT/5 IE Right    Right       Elbow Flexion            Elbow Extension  Forearm Supination            Forearm Pronation       4   Wrist Flexion 4     5   Wrist Extension 5         Radial Deviation           Ulnar Deviation       (blank fields were intentionally left blank)                      Treatment     Therapeutic Exercises - Justified to address any of the following: develop strength, endurance, ROM and/or flexibility.     Warm up on UBE 2'/2' ea c subjective taken for today's planning     sidelying open books 5" x 10 B    Manual stretching of cervical spine at end-range lateral flexion and rotation with prolonged holds    Updated measurements and goals    Neuromuscular Re-Education - Justified to address any of the following: of movement, balance, coordination, kinesthetic sense, posture and/or proprioception for sitting and/or standing activities.   Prone repeated thoracic extension 2 x 10 with pillow under abdomen verbal cuing to avoid shoulder shrug to inhibit upper trap    Seated B shoulder ER against RTB 5" holds 2 x 10 verbal cuing on scap retraction        Manual Therapy - Justified to address any of the following:  Mobilization of joints and soft tissues, manipulation, manual lymphatic drainage, and/or manual traction.    STM to cervical parpspinals, left upper trap, levator scapulae in supine    Manual cervical distraction in supine    Therapeutic Activity - Justified to address the following: activities to improve functional performance.    Standing B shoulder scaption 1# DBs 2 x 10 verbal cuing on to avoid shoulder shrug    Standing B shoulder Ts  1#  DB 2 x 10 verbal cuing on scap retraction    Educated patient on proper positioning when laying sidelying. Placed pillows under neck to support and limit excessive side  bending , pillow in between knees and support under abdomen for neutral spine    Home Exercises   Access Code: [email or web address]  URL: [email or web address]  Date: 06/12/2020  Prepared by: Jilda Panda    Exercises  Standing Cervical Retraction - 2 x daily - 7 x weekly - 10 reps - 1 sets - 5 hold  Seated Cervical Sidebending Stretch - 2 x daily - 7 x weekly - 5 reps - 1 sets - 30" hold  Supine Deep Neck Flexor Training - Repetitions - 1 x daily - 7 x weekly - 10 reps - 3 sets  Sidelying Thoracic Rotation with Open Book - 1 x daily - 7 x weekly - 2 sets - 10 reps - 10 hold  Seated Levator Scapulae Stretch - 2 x daily - 7 x weekly - 5 reps - 1 sets - 30" hold  Standing Cervical Rotation AROM with Overpressure - 1 x daily - 7 x weekly - 2 sets - 10 reps - 5" hold  Seated Shoulder External Rotation with Dumbbells - 1 x daily - 7 x weekly - 2 sets - 10 reps - 10 hold  Scaption with Dumbbells - 1 x daily - 7 x weekly - 3 sets - 10 reps  Standing Shoulder Horizontal Abduction with Resistance - 1 x daily - 7 x weekly - 3 sets - 10 reps         ---      ---  Total Time   Timed Minutes  43 minutes   Total Time  43 minutes        Assessment   Panda demonstrated significant improvement in cervical range of motion post manual therapy and prolonged stretching. She continues to be challenged with thoracic extension in prone due to increased shoulder pain with weightbearing. She was able to perform overhead lifting 1# DB with cuing provided to avoid forward head and maintain neutral spine.Patient will continue to benefit from skilled PT to strengthen B UE strengthening to decrease using cervical muscles for compensation due to weakness.  Plan   FOTO next visit. Update measurements. Add Ys, Ts, and Is to HEP. Pt will be added to wait list for appointments in July. Inform patient to schedule appointments for August.      Goals    Goal 1: Increasing range of cervical rotation to 75 deg for looking to her blind spot without  turning her body    06/04/20 AA: left- 36 deg, right- 35 deg, with improved pain tolerance.    06/19/20 AA: left 60 deg, right 60 deg with slight discomfort with turning her head.   Sessions: 16      Goal 2: Increasing range of cervical flexion to 40 deg for reading without pain     06/04/20 AA: 25 deg flexion with improved pain with task    06/19/20 AA: 53 deg of flexion with improved tolerance with reading and looking down.   Sessions: 16      Goal 3: Increasing range of cervical side bending to 40 deg for sleeping without being awakened by her neck pain     06/04/20 AA: left- 30 deg, right- 25 deg. Patient unable to get comfortable in bed. Sleeping in recliner.    06/19/20 AA: 20 deg left, 20 deg right, educated patient on positioning with pillows to support head in sidelying in order to decrease pain.   Sessions: 16      Goal 4: Increasing range of cervical extension to 50 deg for reaching for higher shelves    06/04/20 AA: 33 deg extension, limited due to pain    06/19/20 AA: 45 deg with mild discomfort with looking up   Sessions: 16          Goal 5: Increasing strength of mid trap to 5/5 for using computer and carrying/ lifting heavy objects for ADL without pain    Sessions: 16      Goal 6: Improving FOTO from 47% to 58% indicative of improved function     06/08/20 MF: FOTO was done with score of 50/58. Patient is showing progress towards goals.   Sessions: 16      Goal 7: Patient to be able to do her HEP independently     05/25/20 MF: HEP was updated with an exercise and modified. Patient requires min cues for postural correction. In progress    Sessions: 16                   Cruzita Lederer, DPT

## 2020-06-21 ENCOUNTER — Inpatient Hospital Stay: Payer: No Typology Code available for payment source | Attending: Internal Medicine

## 2020-06-21 DIAGNOSIS — M542 Cervicalgia: Secondary | ICD-10-CM | POA: Insufficient documentation

## 2020-06-21 NOTE — PT/OT Therapy Note (Cosign Needed)
Name: Hannah Horton Age: 59 y.o.   Date of Service: 06/21/2020  Referring Physician: Unice Cobble, MD   Date of Injury: 05/03/2020  Date Care Plan Established/Reviewed: 05/24/2020  Date Treatment Started: 05/24/2020  End of Certification Date: 08/21/2020  Sessions in Plan of Care: 16  Surgery Date: No data was found    Visit Count: 8   Diagnosis:   1. Cervicalgia        Subjective     Outcome Measure   Tool Used/Details: FOTO 06/08/20  Score: 50  Predicted Functional Outcome: 2    Social Support/Occupation  Lives in: multiple level home  Lives with: alone  Occupation: teacher     Patient reports she is feeling tightness in B cervical muscles today, but more on L side.      Precautions: hypertension  Allergies: Patient has no known allergies.      Objective   Initial Evaluation Reference and/or Current Measurements(as dated):         Range of Motion      AROM: Cervical/Thoracic Spine     IE  06/04/20 06/19/20   Cervical Flexion 15 pain 25 53   Cervical Extension 30 pain 33 45   Thoracic Flexion        Thoracic Extension        (blank fields were intentionally left blank)      IE Left AROM     06/04/20  Left AROM 06/19/20 Left AROM Cervical/Thoracic  IE Right AROM  06/04/20  Right AROM 06/19/20 Right AROM   10 pain 30 20 Cervical Lateral Flexion 15 pain 25 20   30  pain 36 60 Cervical Rotation 25 pain 35 60        Thoracic Lateral Flexion             Thoracic Rotation        (blank fields were intentionally left blank)           Strength      IE   Left 06/21/20 Strength  Shoulder  MMT IE   Right 06/21/20         Neck Lateral Flexion       4+ 4  Shoulder Flexion 4+ 4       Shoulder Extension       4+ 4  Shoulder Abduction 4+ 4       Shoulder IR           Shoulder ER           Serratus           Rhomboids       5   Upper Trapezius 5     3-   Middle Trapezius 3-         Lower Trapezius       5   Biceps 5     5   Triceps 5     (blank fields were intentionally left blank)     IE  Left    Left Strength  Elbow  MMT/5 IE Right     Right       Elbow Flexion            Elbow Extension           Forearm Supination            Forearm Pronation       4   Wrist Flexion 4     5   Wrist Extension 5  Radial Deviation           Ulnar Deviation       (blank fields were intentionally left blank)                      Treatment     Therapeutic Exercises - Justified to address any of the following: develop strength, endurance, ROM and/or flexibility.     MMT was done and patent is slowly showing progress. In progress    Warm up on UBE 2'/2' ea c subjective taken for today's planning     B shoulder flexion with 2# 3x10         Neuromuscular Re-Education - Justified to address any of the following: of movement, balance, coordination, kinesthetic sense, posture and/or proprioception for sitting and/or standing activities.     Ball squeeze against the wall with chin tuck to engage cervical extensors 10" hold 2x10    Shoulder rows with GTB 3x10, cued for scapula squeeze     Manual Therapy - Justified to address any of the following:  Mobilization of joints and soft tissues, manipulation, manual lymphatic drainage, and/or manual traction.      STM to L cervical parpspinals, left upper trap, levator scapulae in sitting         Therapeutic Activity - Justified to address the following: activities to improve functional performance.    Standing B shoulder scaption 1# DBs 2 x 10 verbal cuing on to avoid shoulder shrug    Educated patient on proper positioning when laying sidelying. Placed pillows under neck to support and limit excessive side bending , pillow in between knees and support under abdomen for neutral spine as well as pillow behind back    Home Exercises   Access Code: [email or web address]  URL: [email or web address]  Date: 06/12/2020  Prepared by: Jilda Panda    Exercises  Standing Cervical Retraction - 2 x daily - 7 x weekly - 10 reps - 1 sets - 5 hold  Seated Cervical Sidebending Stretch - 2 x daily - 7 x weekly - 5 reps - 1 sets - 30"  hold  Supine Deep Neck Flexor Training - Repetitions - 1 x daily - 7 x weekly - 10 reps - 3 sets  Sidelying Thoracic Rotation with Open Book - 1 x daily - 7 x weekly - 2 sets - 10 reps - 10 hold  Seated Levator Scapulae Stretch - 2 x daily - 7 x weekly - 5 reps - 1 sets - 30" hold  Standing Cervical Rotation AROM with Overpressure - 1 x daily - 7 x weekly - 2 sets - 10 reps - 5" hold  Seated Shoulder External Rotation with Dumbbells - 1 x daily - 7 x weekly - 2 sets - 10 reps - 10 hold  Scaption with Dumbbells - 1 x daily - 7 x weekly - 3 sets - 10 reps  Standing Shoulder Horizontal Abduction with Resistance - 1 x daily - 7 x weekly - 3 sets - 10 reps         ---      ---   Total Time   Timed Minutes  45 minutes   Total Time  45 minutes        Assessment   Hannah Horton demonstrated good tolerance to manual and strengthening this session. Patient demonstrates with moderate trigger points and tenderness in the L cervical muscles and moderate UE weakness which requires more rest breaks with exercises. Patient will continue  to benefit from continuing skilled PT to increase B UE strength as well as decreasing tension and tenderness in B cervical muscles.  Plan   FOTO next visit. Update measurements. Add Ys, Ts, and Is to HEP. Pt will be added to wait list for appointments in July. Inform patient to schedule appointments for August.       Goals    Goal 1: Increasing range of cervical rotation to 75 deg for looking to her blind spot without turning her body    06/04/20 AA: left- 36 deg, right- 35 deg, with improved pain tolerance.    06/19/20 AA: left 60 deg, right 60 deg with slight discomfort with turning her head.   Sessions: 16      Goal 2: Increasing range of cervical flexion to 40 deg for reading without pain     06/04/20 AA: 25 deg flexion with improved pain with task    06/19/20 AA: 53 deg of flexion with improved tolerance with reading and looking down.   Sessions: 16      Goal 3: Increasing range of cervical side bending  to 40 deg for sleeping without being awakened by her neck pain     06/04/20 AA: left- 30 deg, right- 25 deg. Patient unable to get comfortable in bed. Sleeping in recliner.    06/19/20 AA: 20 deg left, 20 deg right, educated patient on positioning with pillows to support head in sidelying in order to decrease pain.   Sessions: 16      Goal 4: Increasing range of cervical extension to 50 deg for reaching for higher shelves    06/04/20 AA: 33 deg extension, limited due to pain    06/19/20 AA: 45 deg with mild discomfort with looking up   Sessions: 16          Goal 5: Increasing strength of mid trap to 5/5 for using computer and carrying/ lifting heavy objects for ADL without pain    Sessions: 16      Goal 6: Improving FOTO from 47% to 58% indicative of improved function     06/08/20 MF: FOTO was done with score of 50/58. Patient is showing progress towards goals.   Sessions: 16      Goal 7: Patient to be able to do her HEP independently     05/25/20 MF: HEP was updated with an exercise and modified. Patient requires min cues for postural correction. In progress    Sessions: 16                   Chiyoko Torrico, LPTA

## 2020-07-12 ENCOUNTER — Other Ambulatory Visit: Payer: Self-pay | Admitting: Internal Medicine

## 2020-07-12 DIAGNOSIS — E041 Nontoxic single thyroid nodule: Secondary | ICD-10-CM

## 2020-08-07 ENCOUNTER — Ambulatory Visit: Payer: No Typology Code available for payment source

## 2020-08-14 ENCOUNTER — Ambulatory Visit: Payer: 59 | Admitting: Podiatry

## 2020-09-20 ENCOUNTER — Encounter (HOSPITAL_COMMUNITY): Payer: Self-pay | Admitting: Emergency Medicine

## 2020-09-20 ENCOUNTER — Emergency Department (HOSPITAL_COMMUNITY)
Admission: EM | Admit: 2020-09-20 | Discharge: 2020-09-21 | Disposition: A | Discharge status: Home / Self Care | Payer: 59 | Attending: Emergency Medicine | Admitting: Emergency Medicine

## 2020-09-20 ENCOUNTER — Other Ambulatory Visit: Payer: Self-pay

## 2020-09-20 ENCOUNTER — Emergency Department (HOSPITAL_COMMUNITY): Payer: 59

## 2020-09-20 DIAGNOSIS — Z79899 Other long term (current) drug therapy: Secondary | ICD-10-CM | POA: Insufficient documentation

## 2020-09-20 DIAGNOSIS — R61 Generalized hyperhidrosis: Secondary | ICD-10-CM | POA: Insufficient documentation

## 2020-09-20 DIAGNOSIS — Z794 Long term (current) use of insulin: Secondary | ICD-10-CM | POA: Insufficient documentation

## 2020-09-20 DIAGNOSIS — Z7984 Long term (current) use of oral hypoglycemic drugs: Secondary | ICD-10-CM | POA: Insufficient documentation

## 2020-09-20 DIAGNOSIS — Z91040 Latex allergy status: Secondary | ICD-10-CM | POA: Insufficient documentation

## 2020-09-20 DIAGNOSIS — E119 Type 2 diabetes mellitus without complications: Secondary | ICD-10-CM | POA: Diagnosis not present

## 2020-09-20 DIAGNOSIS — R1013 Epigastric pain: Secondary | ICD-10-CM | POA: Diagnosis not present

## 2020-09-20 DIAGNOSIS — N3000 Acute cystitis without hematuria: Secondary | ICD-10-CM | POA: Insufficient documentation

## 2020-09-20 DIAGNOSIS — R079 Chest pain, unspecified: Secondary | ICD-10-CM | POA: Insufficient documentation

## 2020-09-20 LAB — COMPREHENSIVE METABOLIC PANEL
ALT: 15 U/L (ref 0–44)
AST: 34 U/L (ref 15–41)
Albumin: 4.4 g/dL (ref 3.5–5.0)
Alkaline Phosphatase: 74 U/L (ref 38–126)
Anion gap: 11 (ref 5–15)
BUN: 15 mg/dL (ref 8–23)
CO2: 20 mmol/L — ABNORMAL LOW (ref 22–32)
Calcium: 9.2 mg/dL (ref 8.9–10.3)
Chloride: 108 mmol/L (ref 98–111)
Creatinine, Ser: 0.77 mg/dL (ref 0.44–1.00)
GFR calc Af Amer: >60 mL/min (ref >60)
GFR calc non Af Amer: >60 mL/min (ref >60)
Glucose, Bld: 110 mg/dL — ABNORMAL HIGH (ref 70–99)
Potassium: 4.4 mmol/L (ref 3.5–5.1)
Sodium: 139 mmol/L (ref 135–145)
Total Bilirubin: 1 mg/dL (ref 0.3–1.2)
Total Protein: 7.9 g/dL (ref 6.5–8.1)

## 2020-09-20 LAB — URINALYSIS, ROUTINE W REFLEX MICROSCOPIC
Bilirubin Urine: NEGATIVE
Glucose, UA: >=500 mg/dL — AB
Hgb urine dipstick: NEGATIVE
Ketones, ur: NEGATIVE mg/dL
Nitrite: NEGATIVE
Protein, ur: NEGATIVE mg/dL
Specific Gravity, Urine: 1.028 (ref 1.005–1.030)
pH: 5 (ref 5.0–8.0)

## 2020-09-20 LAB — CBC
HCT: 42 % (ref 36.0–46.0)
Hemoglobin: 14.1 g/dL (ref 12.0–15.0)
MCH: 29.2 pg (ref 26.0–34.0)
MCHC: 33.6 g/dL (ref 30.0–36.0)
MCV: 87 fL (ref 80.0–100.0)
Platelets: 357 10*3/uL (ref 150–400)
RBC: 4.83 MIL/uL (ref 3.87–5.11)
RDW: 13.2 % (ref 11.5–15.5)
WBC: 6.3 10*3/uL (ref 4.0–10.5)
nRBC: 0 % (ref 0.0–0.2)

## 2020-09-20 LAB — LIPASE, BLOOD: Lipase: 64 U/L — ABNORMAL HIGH (ref 11–51)

## 2020-09-20 MED ORDER — ONDANSETRON HCL 4 MG/2ML IJ SOLN
4.0000 mg | Freq: Once | INTRAMUSCULAR | Status: AC
  Administered 2020-09-20: 4 mg via INTRAVENOUS
  Filled 2020-09-20: qty 2

## 2020-09-20 MED ORDER — ACETAMINOPHEN 325 MG PO TABS
650.0000 mg | ORAL_TABLET | Freq: Once | ORAL | Status: AC
  Administered 2020-09-20: 650 mg via ORAL
  Filled 2020-09-20: qty 2

## 2020-09-20 NOTE — ED Triage Notes (Signed)
Pt states she was sent by pcp to be admitted for pancreatitis.

## 2020-09-20 NOTE — ED Provider Notes (Signed)
[facility] EMERGENCY DEPARTMENT Provider Note   CSN: [id number] Arrival date & time: 09/20/20  1435     History Chief Complaint  Patient presents with  . Abdominal Pain    Carly Maxwell is a 59 y.o. female with a history of diabetes mellitus type 2, chronic constipation who presents to the emergency department with a chief complaint of epigastric pain.  Patient reports that she was having malodorous urine around September 22 and was seen by her PCP and diagnosed with a UTI.  She was prescribed Macrobid, but per chart review did not start the medication until September 26.  Malodorous urine has since resolved.  She was seen by primary care yesterday and was told to discontinue the Macrobid and start taking ciprofloxacin, but she has not yet started the medication because she has been feeling poorly.  Per chart review, she was also given 1 g of Rocephin in the ER as there works concerned that she may be developing pyelonephritis due to delayed initiation of antibiotic therapy for UTI.  Yesterday, she was at work when she broke out into a sweat at work, which resolved after she walked into the freezer to cool off.  She reports that she had a second episode of diaphoresis, which is since resolved.    Following the second episode, she developed sudden onset, constant epigastric pain, which she characterizes as cramping and achy.  The pain has been gradually worsening since onset.  It radiates around her bilateral upper abdomen and through to her back.  She has had associated fever, chills, nausea, but no vomiting, diarrhea, worse than baseline constipation, flank pain, dysuria, chest pain, shortness of breath, cough, URI symptoms, vaginal bleeding or discharge, burping or belching.  States that she was advised to come to the ER for further evaluation as there was concern she may be developing pancreatitis as labs yesterday demonstrated slight increase in lipase  Surgical  history includes hysterectomy, cholecystectomy, and oophorectomy.  No history of pancreatitis.  She does not smoke.  She does not drink alcohol.  No recreational or illicit drugs.  The history is provided by the patient and medical records. No language interpreter was used.    HPI: A 59 year old patient with a history of treated diabetes presents for evaluation of chest pain. Initial onset of pain was more than 6 hours ago. The patient's chest pain is well-localized and is not worse with exertion. The patient complains of nausea and reports some diaphoresis. The patient's chest pain is middle- or left-sided, is not described as heaviness/pressure/tightness, is not sharp and does not radiate to the arms/jaw/neck. The patient has no history of stroke, has no history of peripheral artery disease, has not smoked in the past 90 days, has no relevant family history of coronary artery disease (first degree relative at less than age 57), is not hypertensive, has no history of hypercholesterolemia and does not have an elevated BMI (>=30).   Past Medical History:  Diagnosis Date  . Diabetes mellitus without complication [facility])     Patient Active Problem List   Diagnosis Date Noted  . Uncontrolled type 2 diabetes mellitus without complication, with long-term current use of insulin 04/29/2018    Past Surgical History:  Procedure Laterality Date  . ABDOMINAL HYSTERECTOMY    . arm surgery    . BREAST BIOPSY Left   . CATARACT EXTRACTION       OB History   No obstetric history on file.     No  family history on file.  Social History   Tobacco Use  . Smoking status: Never Smoker  . Smokeless tobacco: Never Used  Vaping Use  . Vaping Use: Never used  Substance Use Topics  . Alcohol use: No  . Drug use: No    Home Medications Prior to Admission medications   Medication Sig Start Date End Date Taking? Authorizing Provider  calcium carbonate (OSCAL) 1500 (600 Ca) MG TABS tablet Take by mouth.  03/12/20   [provider]  Cholecalciferol 25 MCG (1000 UT) capsule Take by mouth. 03/12/20   [provider]  conjugated estrogens (PREMARIN) vaginal cream Apply to affected area daily at night for two weeks, then twice a week for at least two months 10/18/19   Anyanwu, Jethro Bastos, MD  Continuous Blood Gluc Sensor (FREESTYLE LIBRE 14 DAY SENSOR) MISC USE 1 DEVICE Q 14 DAYS 05/02/19   [provider]  cyanocobalamin 1000 MCG tablet Take by mouth. 05/01/20   [provider]  dorzolamide-timolol (COSOPT) 22.3-6.8 MG/ML ophthalmic solution Place 1 drop into both eyes 2 (two) times daily.    [provider]  glipiZIDE (GLUCOTROL XL) 2.5 MG 24 hr tablet  07/25/19   [provider]  hydrochlorothiazide (MICROZIDE) 12.5 MG capsule Take 12.5 mg by mouth daily.    [provider]  insulin aspart protamine - aspart (NOVOLOG MIX 70/30 FLEXPEN) (70-30) 100 UNIT/ML FlexPen Inject 18 Units into the skin 2 (two) times daily with a meal.  01/03/19   [provider]  Insulin Degludec (TRESIBA FLEXTOUCH) 200 UNIT/ML SOPN Inject into the skin. 07/25/19   [provider]  lidocaine (XYLOCAINE) 2 % solution Use as directed 15 mLs in the mouth or throat every 3 (three) hours as needed for mouth pain. 09/21/20   Myliyah Rebuck A, PA-C  lisinopril (PRINIVIL,ZESTRIL) 5 MG tablet Take 5 mg by mouth daily.    [provider]  losartan (COZAAR) 50 MG tablet TK 1 T PO QD 04/07/19   [provider]  losartan-hydrochlorothiazide (HYZAAR) 50-12.5 MG tablet  12/27/18   [provider]  meclizine (ANTIVERT) 25 MG tablet Take 1 tablet (25 mg total) by mouth 3 (three) times daily as needed for dizziness. 04/23/19   Henderly, Britni A, PA-C  meloxicam (MOBIC) 15 MG tablet Take 15 mg by mouth daily. 04/30/20   [provider]  metFORMIN (GLUCOPHAGE) 1000 MG tablet Take 1 tablet (1,000 mg total) by mouth 2 (two) times daily with a meal.  03/03/18   Roxy Horseman, PA-C  omeprazole (PRILOSEC) 40 MG capsule Take 1 capsule (40 mg total) by mouth daily for 14 days. 09/21/20 10/05/20  Shykeria Sakamoto A, PA-C  ondansetron (ZOFRAN ODT) 4 MG disintegrating tablet Take 1 tablet (4 mg total) by mouth every 8 (eight) hours as needed for nausea or vomiting. Patient not taking: Reported on 10/18/2019 06/17/18   Naleah Kofoed, Pedro Earls A, PA-C  oxyCODONE-acetaminophen (PERCOCET/ROXICET) 5-325 MG tablet Take 1 tablet by mouth every 8 (eight) hours as needed for severe pain. Patient not taking: Reported on 10/18/2019 06/17/18   Kate Sweetman, Pedro Earls A, PA-C  OZEMPIC, 0.25 OR 0.5 MG/DOSE, 2 MG/1.5ML SOPN  07/25/19   [provider]  sucralfate (CARAFATE) 1 g tablet Take 1 tablet (1 g total) by mouth 4 (four) times daily for 14 days. 09/21/20 10/05/20  Toula Miyasaki A, PA-C  SYNJARDY XR 25-1000 MG TB24 Take 1 tablet by mouth at bedtime. 05/07/20   [provider]  travoprost, benzalkonium, (TRAVATAN) 0.004 %  ophthalmic solution Place 1 drop into both eyes at bedtime.    [provider]    Allergies    Latex and Lantus [insulin glargine]  Review of Systems   Review of Systems  Constitutional: Negative for activity change, chills and fever.  HENT: Negative for congestion, sinus pressure, sinus pain, sore throat and voice change.   Eyes: Negative for visual disturbance.  Respiratory: Negative for cough, choking and shortness of breath.   Cardiovascular: Negative for chest pain, palpitations and leg swelling.  Gastrointestinal: Positive for abdominal pain, constipation (chronic) and nausea. Negative for diarrhea, rectal pain and vomiting.  Genitourinary: Negative for dysuria, flank pain, frequency, urgency, vaginal bleeding, vaginal discharge and vaginal pain.       Malodorous urine (resolved)  Musculoskeletal: Negative for back pain and myalgias.  Skin: Negative for rash.  Allergic/Immunologic: Negative for immunocompromised state.    Neurological: Negative for dizziness, seizures, syncope, weakness, numbness and headaches.  Psychiatric/Behavioral: Negative for confusion.    Physical Exam Updated Vital Signs BP 131/75   Pulse 68   Temp 98.1 F (36.7 C) (Oral)   Resp 17   Ht  (1.6 m)   Wt 73.8 kg   SpO2 99%   BMI 28.82 kg/m   Physical Exam Vitals and nursing note reviewed.  Constitutional:      General: She is not in acute distress.    Appearance: She is not ill-appearing, toxic-appearing or diaphoretic.     Comments: Uncomfortable appearing   HENT:     Head: Normocephalic.     Nose: Nose normal.     Mouth/Throat:     Mouth: Mucous membranes are moist.     Pharynx: No oropharyngeal exudate or posterior oropharyngeal erythema.  Eyes:     Extraocular Movements: Extraocular movements intact.     Conjunctiva/sclera: Conjunctivae normal.     Pupils: Pupils are equal, round, and reactive to light.  Cardiovascular:     Rate and Rhythm: Normal rate and regular rhythm.     Pulses: Normal pulses.     Heart sounds: Normal heart sounds. No murmur heard.  No friction rub. No gallop.   Pulmonary:     Effort: Pulmonary effort is normal. No respiratory distress.     Breath sounds: No stridor. No wheezing, rhonchi or rales.     Comments: Lungs are clear to auscultation bilaterally Chest:     Chest wall: No tenderness.  Abdominal:     General: There is no distension.     Palpations: Abdomen is soft. There is no mass.     Tenderness: There is abdominal tenderness. There is guarding. There is no right CVA tenderness, left CVA tenderness or rebound.     Hernia: No hernia is present.     Comments: Tender to palpation in the epigastric region.  There is voluntary guarding, but no rebound.  No CVA tenderness bilaterally.  Negative Murphy sign.  No tenderness over McBurney's point.  Abdomen is soft and nondistended.  Normoactive bowel sounds.  Musculoskeletal:        General: No tenderness.     Cervical back: Neck  supple.     Right lower leg: No edema.     Left lower leg: No edema.  Skin:    General: Skin is warm.     Findings: No rash.  Neurological:     Mental Status: She is alert.  Psychiatric:        Behavior: Behavior normal.     ED Results /  Procedures / Treatments   Labs (all labs ordered are listed, but only abnormal results are displayed) Labs Reviewed  LIPASE, BLOOD - Abnormal; Notable for the following components:      Result Value   Lipase 64 (*)    All other components within normal limits  COMPREHENSIVE METABOLIC PANEL - Abnormal; Notable for the following components:   CO2 20 (*)    Glucose, Bld 110 (*)    All other components within normal limits  URINALYSIS, ROUTINE W REFLEX MICROSCOPIC - Abnormal; Notable for the following components:   APPearance CLOUDY (*)    Glucose, UA >=500 (*)    Leukocytes,Ua MODERATE (*)    Bacteria, UA FEW (*)    All other components within normal limits  CBC  TROPONIN I (HIGH SENSITIVITY)  TROPONIN I (HIGH SENSITIVITY)    EKG EKG Interpretation  Date/Time:  Friday September 21 2020 01:22:57 EDT Ventricular Rate:  68 PR Interval:  160 QRS Duration: 82 QT Interval:  426 QTC Calculation: 452 R Axis:   40 Text Interpretation: Normal sinus rhythm Normal ECG When compared with ECG of 04/23/2019, No significant change was found Confirmed by Dione Booze ([id number]) on 09/21/2020 1:36:58 AM   Radiology CT ABDOMEN PELVIS W CONTRAST  Result Date: 09/21/2020 CLINICAL DATA:  Suspected pancreatitis EXAM: CT ABDOMEN AND PELVIS WITH CONTRAST TECHNIQUE: Multidetector CT imaging of the abdomen and pelvis was performed using the standard protocol following bolus administration of intravenous contrast. CONTRAST:  OMNIPAQUE IOHEXOL 300 MG/ML  SOLN COMPARISON:  06/17/2018 and 04/23/2019 FINDINGS: Lower chest: Lung bases are clear. Hepatobiliary: No focal liver abnormality is seen. Status post cholecystectomy. No biliary dilatation. Pancreas:  Unremarkable. No pancreatic ductal dilatation or surrounding inflammatory changes. Spleen: Normal in size without focal abnormality. Adrenals/Urinary Tract: No adrenal gland nodules. Cyst on the left kidney. Renal nephrograms are symmetrical and homogeneous. No hydronephrosis or hydroureter. Bladder is unremarkable. Stomach/Bowel: Stomach is within normal limits. Appendix appears normal. No evidence of bowel wall thickening, distention, or inflammatory changes. Vascular/Lymphatic: No significant vascular findings are present. No enlarged abdominal or pelvic lymph nodes. Reproductive: Status post hysterectomy. No adnexal masses. Other: No abdominal wall hernia or abnormality. No abdominopelvic ascites. Musculoskeletal: No acute or significant osseous findings. IMPRESSION: No acute process demonstrated in the abdomen or pelvis. No evidence of bowel obstruction or inflammation. Electronically Signed   By: Burman Nieves M.D.   On: 09/21/2020 00:22    Procedures Procedures (including critical care time)  Medications Ordered in ED Medications  ondansetron (ZOFRAN) injection 4 mg (4 mg Intravenous Given 09/20/20 2344)  acetaminophen (TYLENOL) tablet 650 mg (650 mg Oral Given 09/20/20 2342)  iohexol (OMNIPAQUE) 300 MG/ML solution 100 mL (100 mLs Intravenous Contrast Given 09/21/20 0004)  alum & mag hydroxide-simeth (MAALOX/MYLANTA) 200-200-20 MG/5ML suspension 30 mL (30 mLs Oral Given 09/21/20 0042)    And  lidocaine (XYLOCAINE) 2 % viscous mouth solution 15 mL (15 mLs Oral Given 09/21/20 0042)  famotidine (PEPCID) IVPB 20 mg premix (0 mg Intravenous Stopped 09/21/20 0204)  sucralfate (CARAFATE) tablet 1 g (1 g Oral Given 09/21/20 0135)    ED Course  I have reviewed the triage vital signs and the nursing notes.  Pertinent labs & imaging results that were available during my care of the patient were reviewed by me and considered in my medical decision making (see chart for details).  01:14 she is now  endorsing that the pain is burning.  Updated patient's on CT findings.  She does have  some cardiac risk factors, including diabetes mellitus.  With her epigastric pain, will order EKG and troponin to further assess for cardiac etiology.  However, if unremarkable, differential diagnosis also includes peptic ulcer disease.  While awaiting troponin, will treat her symptoms with Carafate and Pepcid and reassess.  Given recent Macrobid prescription, also consider pill esophagitis, but less likely as she is not taking medication at bedtime.    MDM Rules/Calculators/A&P HEAR Score: 273                        59 year old female with a history of diabetes mellitus type 2, chronic constipation presenting with epigastric abdominal pain and nausea, onset yesterday.  She had 2 episodes of diaphoresis that began prior to onset of pain, but this is since resolved.  She is currently being treated for a UTI that grew pansensitive E. coli.  She was initially seen on 922, but did not initiate antibiotics till 9/26.  Per chart review, there was concern that patient may be developing pyelonephritis due to delay in antibiotic initiation and she was given 1 g of Rocephin by her PCP yesterday and advised to change her antibiotics to ciprofloxacin, which she has not yet initiated due to feeling poorly from her current abdominal pain.  Lipase was noted to be 88 and amylase 174 performed at her PCPs office through an outside lab.  Lipase today in the ER is 64.  She has no leukocytosis.  No other metabolic derangements.  UA with moderate leukocyte esterase and pyuria.  On exam, she does have focal tenderness palpation in the epigastric region with voluntary guarding.  She looks uncomfortable.  She is nontoxic-appearing.  Will order CT abdomen pelvis for further evaluation of pancreatitis as well as further evaluation for possible pyelonephritis.  Unfortunately, she does not have a ride home from the ER and will be given Tylenol for  pain control since she has to potentially drive and Zofran for nausea.  She is agreeable with the work-up and plan at this time.  EKG with normal sinus rhythm.  Initial troponin is 4.  Her onset of pain has been more than 3 hours and her heart score is 3.  Based on hear score algorithm, she is appropriate for discharge and repeat troponin is not indicated.  Doubt ACS.  On reevaluation, pain has markedly improved after Carafate and Pepcid.  She is feeling much better and has been able to eat and drink without difficulty.  I have a strong suspicion for underlying peptic ulcer disease.  Will discharge patient home with omeprazole and Carafate as well as a referral to gastroenterology.  Her hemoglobin is stable and she is having no melena or hematochezia.  A very low suspicion for ruptured peptic ulcer.  At this time, she is hemodynamically stable and in no acute distress.  Safe for discharge to home with outpatient follow-up as indicated.   Final Clinical Impression(s) / ED Diagnoses Final diagnoses:  Epigastric pain  Acute cystitis without hematuria    Rx / DC Orders ED Discharge Orders         Ordered    omeprazole (PRILOSEC) 40 MG capsule  Daily        09/21/20 0234    sucralfate (CARAFATE) 1 g tablet  4 times daily        09/21/20 0234    lidocaine (XYLOCAINE) 2 % solution  Every  3 hours PRN        09/21/20 0234  McDonaldBarkley BoardsC 09/21/20 0246    Dione Booze, MD 09/21/20 [phone]

## 2020-09-20 NOTE — ED Notes (Signed)
Patient transported to CT 

## 2020-09-21 LAB — TROPONIN I (HIGH SENSITIVITY): Troponin I (High Sensitivity): 4 ng/L (ref <18)

## 2020-09-21 MED ORDER — SUCRALFATE 1 G PO TABS
1.0000 g | ORAL_TABLET | Freq: Once | ORAL | Status: AC
  Administered 2020-09-21: 1 g via ORAL
  Filled 2020-09-21: qty 1

## 2020-09-21 MED ORDER — LIDOCAINE VISCOUS HCL 2 % MT SOLN
15.0000 mL | Freq: Once | OROMUCOSAL | Status: AC
  Administered 2020-09-21: 15 mL via ORAL
  Filled 2020-09-21: qty 15

## 2020-09-21 MED ORDER — OMEPRAZOLE 40 MG PO CPDR: 40.0000 mg | DELAYED_RELEASE_CAPSULE | Freq: Every day | ORAL | 0 refills | Status: AC

## 2020-09-21 MED ORDER — ALUM & MAG HYDROXIDE-SIMETH 200-200-20 MG/5ML PO SUSP
30.0000 mL | Freq: Once | ORAL | Status: AC
  Administered 2020-09-21: 30 mL via ORAL
  Filled 2020-09-21: qty 30

## 2020-09-21 MED ORDER — FAMOTIDINE IN NACL 20-0.9 MG/50ML-% IV SOLN
20.0000 mg | Freq: Once | INTRAVENOUS | Status: AC
  Administered 2020-09-21: 20 mg via INTRAVENOUS
  Filled 2020-09-21: qty 50

## 2020-09-21 MED ORDER — IOHEXOL 300 MG/ML  SOLN
100.0000 mL | Freq: Once | INTRAMUSCULAR | Status: AC | PRN
  Administered 2020-09-21: 100 mL via INTRAVENOUS

## 2020-09-21 MED ORDER — SUCRALFATE 1 G PO TABS: 1.0000 g | ORAL_TABLET | Freq: Four times a day (QID) | ORAL | 0 refills | Status: AC

## 2020-09-21 MED ORDER — LIDOCAINE VISCOUS HCL 2 % MT SOLN: 15.0000 mL | OROMUCOSAL | 0 refills | Status: AC | PRN

## 2020-09-21 NOTE — ED Notes (Signed)
Discharge instructions discussed with pt. Pt verbalized understanding with no questions at this time. Pt to follow up with GI.

## 2020-09-21 NOTE — ED Notes (Signed)
PT provided with diet sprite and [place] sandwhich.

## 2020-09-21 NOTE — ED Notes (Addendum)
Per Mia PA pt able to eat/drink

## 2020-09-21 NOTE — Discharge Instructions (Addendum)
Thank you for allowing me to care for you today in the Emergency Department.   You were seen today for upper abdominal pain and nausea.  Your work-up was concerning for peptic ulcer disease.  Peptic ulcer disease is officially diagnosed after a procedure called an endoscopy is formed where the ulcer is visualized.  Your CT scan did not show pancreatitis.  Your urine infection appears to be improving.  Your work-up for your heart today was also reassuring.  I would recommend calling tomorrow to the gastroneurology office to see when they can make an appointment for follow-up.  Their contact information is listed above.  Take 1 tablet of omeprazole daily for the next 14 days.  Take 1 tablet of Carafate every 6 hours for the next 2 weeks.  You can swallow 15 mL of viscous lidocaine every 3 hours as needed for pain.  Make sure to avoid alcohol and medications that are called NSAIDs, which include ibuprofen, Motrin, naproxen, and Aleve.  Make sure to take your ciprofloxacin.  Make sure that you take it with at least 8 ounces of water and at least 15 minutes before bedtime.  You should return to the emergency department if you develop black, tarry stool, blood in your stool, vomiting with blood, high fevers and severe abdominal pain, or other new, concerning symptoms.

## 2020-09-22 DIAGNOSIS — M542 Cervicalgia: Secondary | ICD-10-CM

## 2020-09-22 NOTE — PT/OT Therapy Note (Signed)
Name: Hannah Horton Age: 59 y.o.   Date of Service: 09/22/2020  Referring Physician:     Date of Injury: 05/03/2020  Date Care Plan Established/Reviewed: 05/24/2020  Date Treatment Started: 05/24/2020    Diagnosis:   1. Cervicalgia        PHYSICAL THERAPY DISCHARGE SUMMARY     Patient has not returned for follow up treatment and therefore is being discharged from PT at this time.                                  Goals    Goal 1: Increasing range of cervical rotation to 75 deg for looking to her blind spot without turning her body    06/04/20 AA: left- 36 deg, right- 35 deg, with improved pain tolerance.    06/19/20 AA: left 60 deg, right 60 deg with slight discomfort with turning her head.   Sessions: 16      Goal 2: Increasing range of cervical flexion to 40 deg for reading without pain     06/04/20 AA: 25 deg flexion with improved pain with task    06/19/20 AA: 53 deg of flexion with improved tolerance with reading and looking down.   Sessions: 16      Goal 3: Increasing range of cervical side bending to 40 deg for sleeping without being awakened by her neck pain     06/04/20 AA: left- 30 deg, right- 25 deg. Patient unable to get comfortable in bed. Sleeping in recliner.    06/19/20 AA: 20 deg left, 20 deg right, educated patient on positioning with pillows to support head in sidelying in order to decrease pain.   Sessions: 16      Goal 4: Increasing range of cervical extension to 50 deg for reaching for higher shelves    06/04/20 AA: 33 deg extension, limited due to pain    06/19/20 AA: 45 deg with mild discomfort with looking up   Sessions: 16          Goal 5: Increasing strength of mid trap to 5/5 for using computer and carrying/ lifting heavy objects for ADL without pain    Sessions: 16      Goal 6: Improving FOTO from 47% to 58% indicative of improved function     06/08/20 MF: FOTO was done with score of 50/58. Patient is showing progress towards goals.   Sessions: 16      Goal 7: Patient to be able to do her HEP  independently     05/25/20 MF: HEP was updated with an exercise and modified. Patient requires min cues for postural correction. In progress    Sessions: 16                   Tanazia Achee, LPTA

## 2020-11-14 ENCOUNTER — Ambulatory Visit: Payer: 59 | Admitting: Podiatry

## 2021-04-23 ENCOUNTER — Ambulatory Visit: Payer: No Typology Code available for payment source

## 2021-06-07 ENCOUNTER — Other Ambulatory Visit (INDEPENDENT_AMBULATORY_CARE_PROVIDER_SITE_OTHER): Payer: Self-pay | Admitting: "Endocrinology

## 2022-09-11 IMAGING — CT CT ABD-PELV W/ CM
2 of 5 series · 16 of 46 positions shown, 18 images · IV contrast (APPLIED)
Comparison: 06/17/2018 and 04/23/2019

CLINICAL DATA: Suspected pancreatitis

EXAM:
CT ABDOMEN AND PELVIS WITH CONTRAST
TECHNIQUE: Multidetector CT imaging of the abdomen and pelvis was performed
using the standard protocol following bolus administration of
intravenous contrast.
CONTRAST:  100mL OMNIPAQUE IOHEXOL 300 MG/ML  SOLN

[Series 3: abd/ pelvis 5.0 i30f 2 · axial · 0.98mm/px · z∈[-465,-50]mm · 13 of 93 slices shown, 15 images]
[im 5/93  soft-tissue]
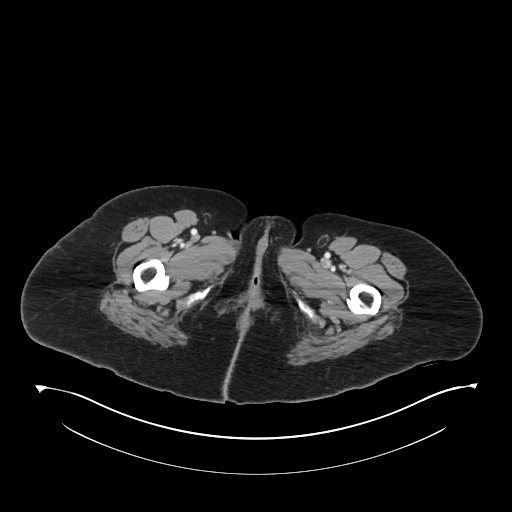
[im 5/93  bone]
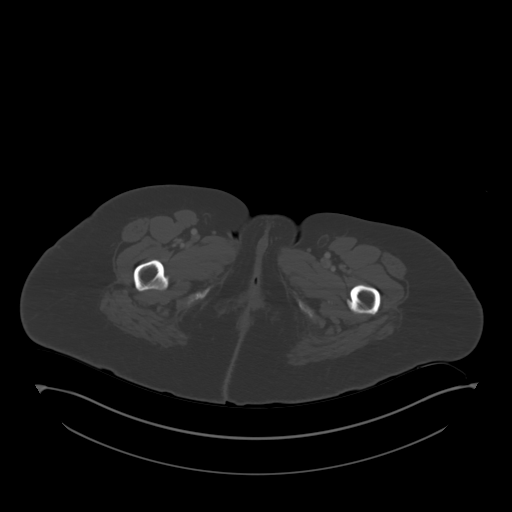
[im 15/93  soft-tissue]
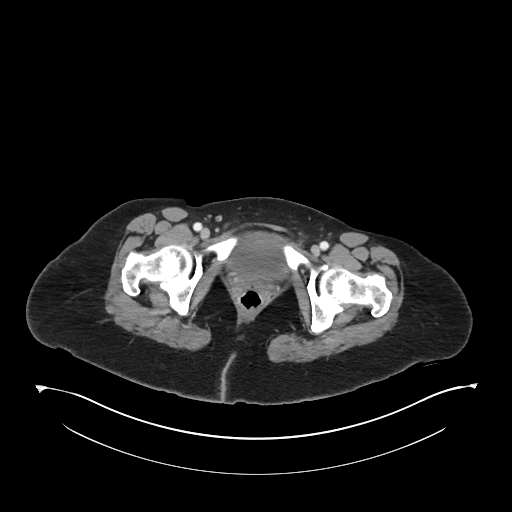
[im 20/93  soft-tissue]
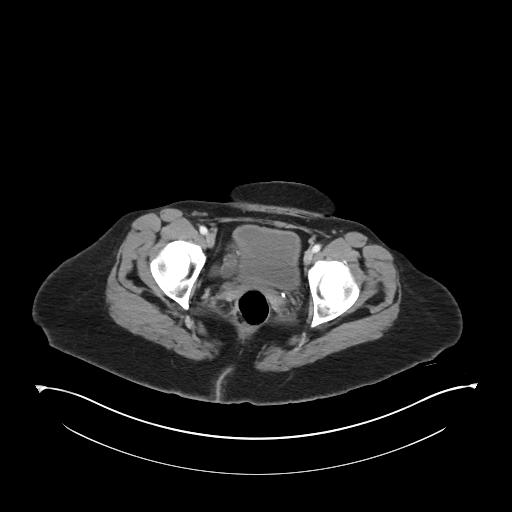
[im 25/93  soft-tissue]
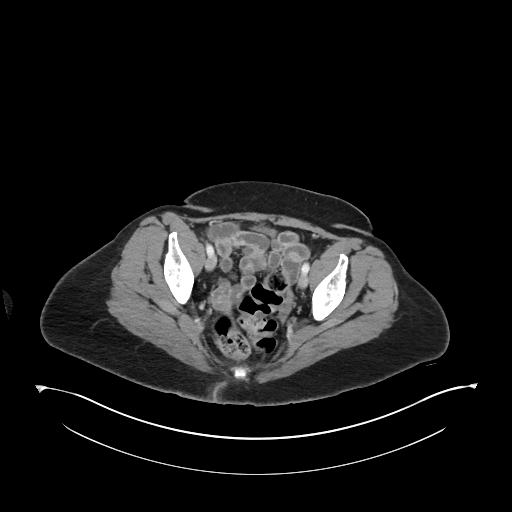
[im 34/93  soft-tissue]
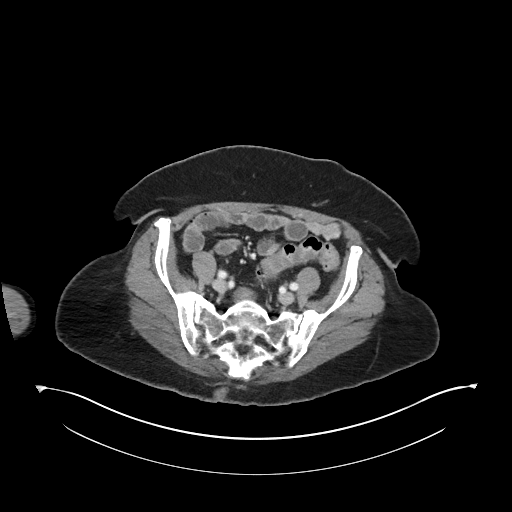
[im 39/93  soft-tissue]
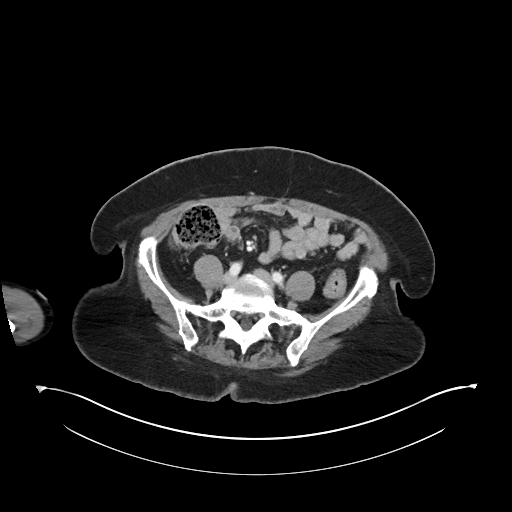
[im 49/93  soft-tissue]
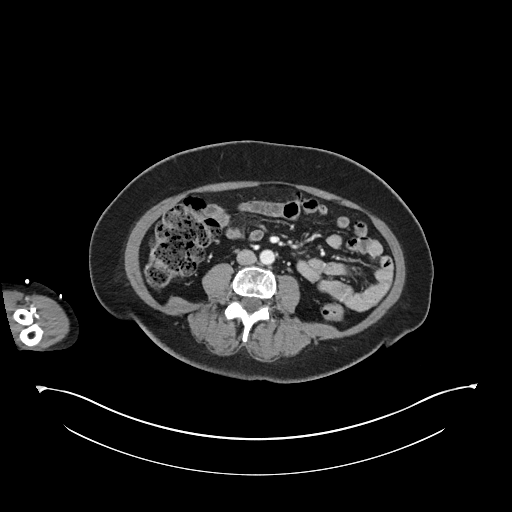
[im 54/93  soft-tissue]
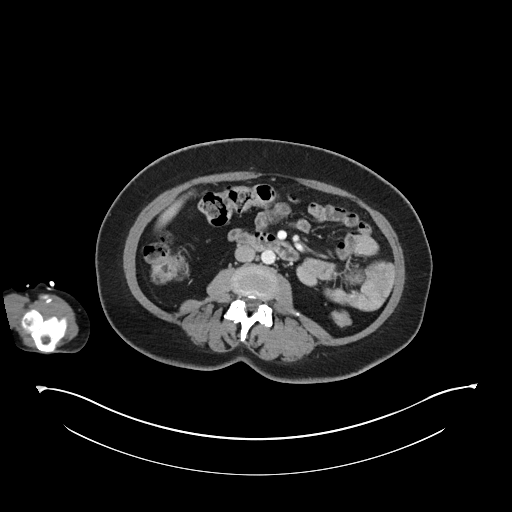
[im 59/93  soft-tissue]
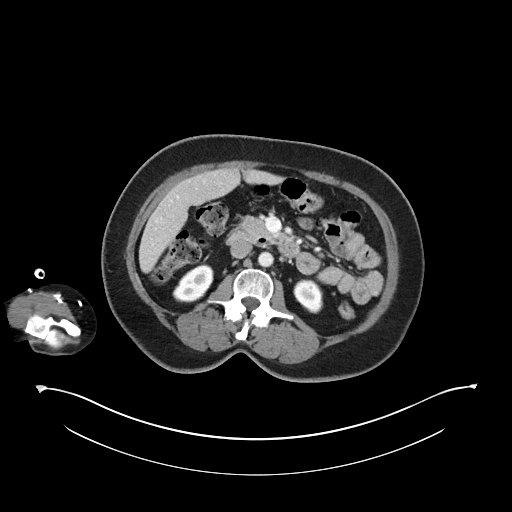
[im 59/93  bone]
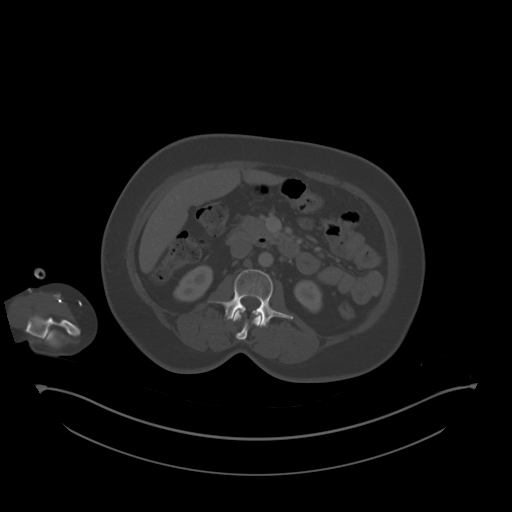
[im 68/93  soft-tissue]
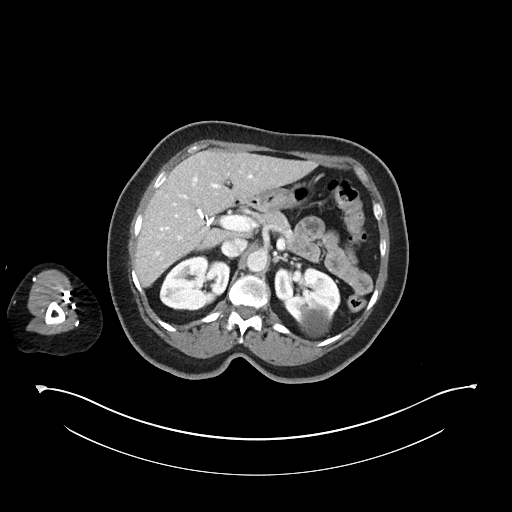
[im 73/93  soft-tissue]
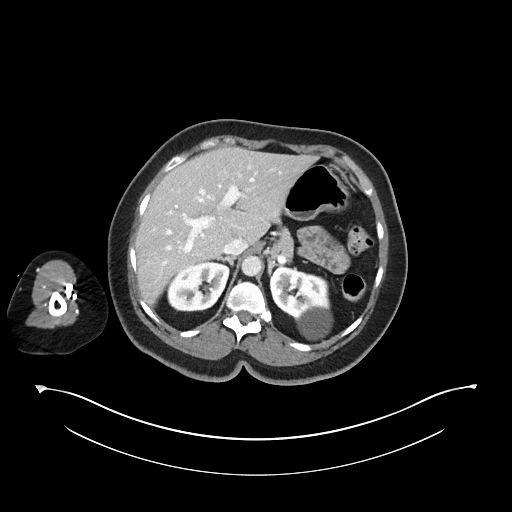
[im 78/93  soft-tissue]
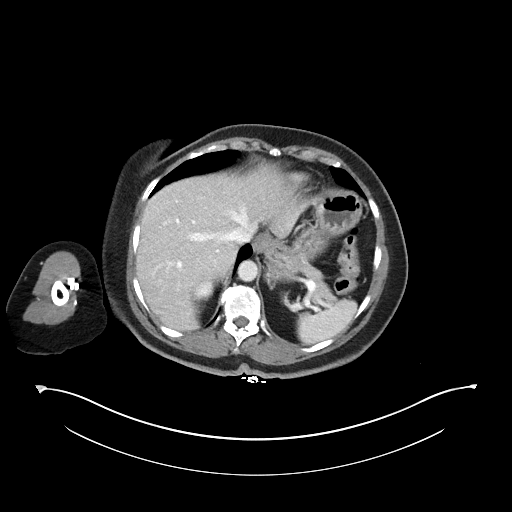
[im 88/93  soft-tissue]
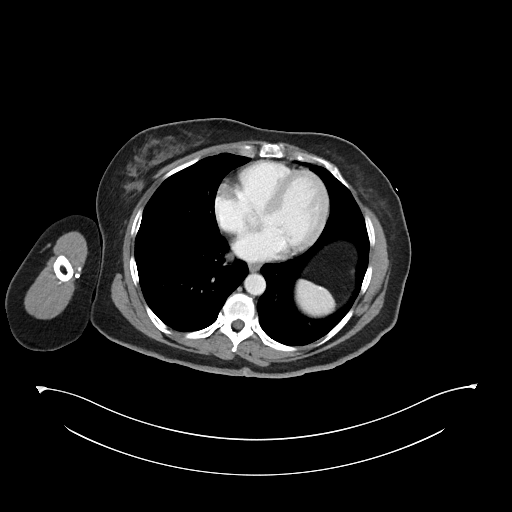

[Series 6: coronal soft tissue · coronal · 0.78mm/px · 3 of 96 slices shown]
[im 32/96  soft-tissue]
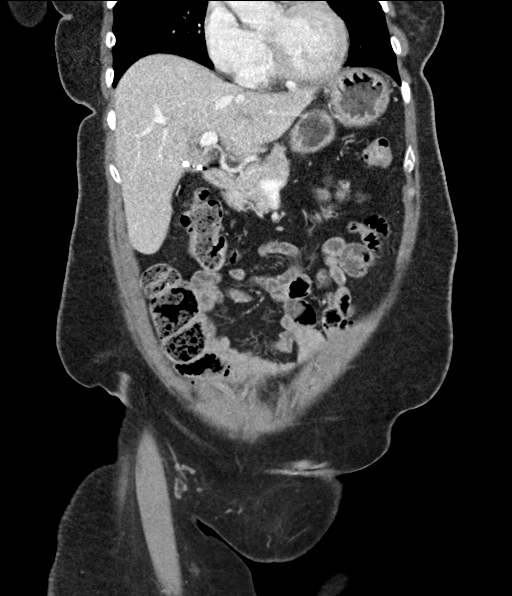
[im 43/96  soft-tissue]
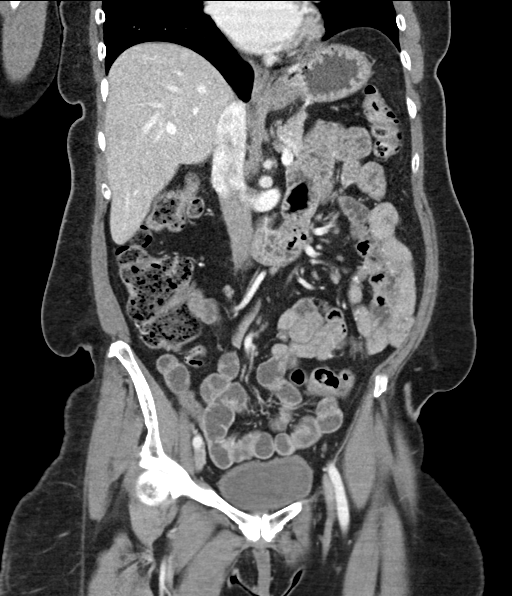
[im 53/96  soft-tissue]
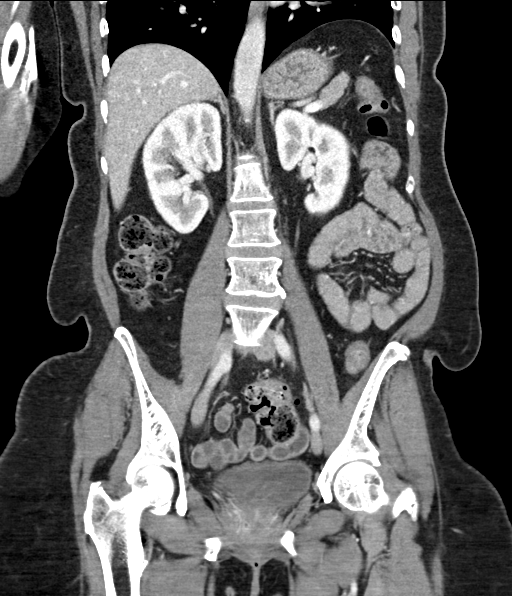

[16 of 46 positions shown; findings below may reference images not displayed]

FINDINGS: Lower chest: Lung bases are clear.

Hepatobiliary: No focal liver abnormality is seen. Status post
cholecystectomy. No biliary dilatation.

Pancreas: Unremarkable. No pancreatic ductal dilatation or
surrounding inflammatory changes.

Spleen: Normal in size without focal abnormality.

Adrenals/Urinary Tract: No adrenal gland nodules. Cyst on the left
kidney. Renal nephrograms are symmetrical and homogeneous. No
hydronephrosis or hydroureter. Bladder is unremarkable.

Stomach/Bowel: Stomach is within normal limits. Appendix appears
normal. No evidence of bowel wall thickening, distention, or
inflammatory changes.

Vascular/Lymphatic: No significant vascular findings are present. No
enlarged abdominal or pelvic lymph nodes.

Reproductive: Status post hysterectomy. No adnexal masses.

Other: No abdominal wall hernia or abnormality. No abdominopelvic
ascites.

Musculoskeletal: No acute or significant osseous findings.
IMPRESSION: No acute process demonstrated in the abdomen or pelvis. No evidence
of bowel obstruction or inflammation.

## 2024-09-23 ENCOUNTER — Other Ambulatory Visit: Payer: Self-pay

## 2025-01-21 ENCOUNTER — Encounter (INDEPENDENT_AMBULATORY_CARE_PROVIDER_SITE_OTHER): Payer: Self-pay

## 2025-01-21 ENCOUNTER — Encounter (INDEPENDENT_AMBULATORY_CARE_PROVIDER_SITE_OTHER)
